# Patient Record
Sex: Female | Born: 1980 | Race: White | Hispanic: No | Marital: Married | Smoking: Never smoker
Health system: Southern US, Community
[De-identification: ages and names within clinical notes are randomized; demographics above are authoritative.]

## PROBLEM LIST (undated history)

## (undated) DIAGNOSIS — R51 Headache: Secondary | ICD-10-CM

## (undated) DIAGNOSIS — K219 Gastro-esophageal reflux disease without esophagitis: Secondary | ICD-10-CM

## (undated) DIAGNOSIS — D229 Melanocytic nevi, unspecified: Secondary | ICD-10-CM

## (undated) DIAGNOSIS — R519 Headache, unspecified: Secondary | ICD-10-CM

## (undated) DIAGNOSIS — K429 Umbilical hernia without obstruction or gangrene: Secondary | ICD-10-CM

## (undated) DIAGNOSIS — IMO0002 Reserved for concepts with insufficient information to code with codable children: Secondary | ICD-10-CM

## (undated) DIAGNOSIS — Z9889 Other specified postprocedural states: Secondary | ICD-10-CM

## (undated) DIAGNOSIS — N803 Endometriosis of pelvic peritoneum: Secondary | ICD-10-CM

## (undated) DIAGNOSIS — G8929 Other chronic pain: Secondary | ICD-10-CM

## (undated) DIAGNOSIS — B029 Zoster without complications: Secondary | ICD-10-CM

## (undated) HISTORY — PX: LAPAROSCOPIC ENDOMETRIOSIS FULGURATION: SUR769

## (undated) HISTORY — DX: Headache, unspecified: R51.9

## (undated) HISTORY — DX: Endometriosis of pelvic peritoneum: N80.3

## (undated) HISTORY — DX: Other chronic pain: G89.29

## (undated) HISTORY — DX: Umbilical hernia without obstruction or gangrene: K42.9

## (undated) HISTORY — DX: Melanocytic nevi, unspecified: D22.9

## (undated) HISTORY — DX: Zoster without complications: B02.9

## (undated) HISTORY — PX: TONSILLECTOMY: SHX5217

## (undated) HISTORY — DX: Headache: R51

## (undated) HISTORY — PX: ABDOMINAL HYSTERECTOMY: SHX81

## (undated) HISTORY — DX: Reserved for concepts with insufficient information to code with codable children: IMO0002

---
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
## 2001-01-17 ENCOUNTER — Encounter (INDEPENDENT_AMBULATORY_CARE_PROVIDER_SITE_OTHER): Payer: Self-pay

## 2001-01-17 ENCOUNTER — Other Ambulatory Visit: Admission: RE | Admit: 2001-01-17 | Discharge: 2001-01-17 | Payer: Self-pay | Admitting: *Deleted

## 2001-02-27 ENCOUNTER — Ambulatory Visit (HOSPITAL_BASED_OUTPATIENT_CLINIC_OR_DEPARTMENT_OTHER): Admission: RE | Admit: 2001-02-27 | Discharge: 2001-02-27 | Payer: Self-pay | Admitting: *Deleted

## 2001-02-27 ENCOUNTER — Encounter (INDEPENDENT_AMBULATORY_CARE_PROVIDER_SITE_OTHER): Payer: Self-pay | Admitting: *Deleted

## 2001-06-03 ENCOUNTER — Other Ambulatory Visit: Admission: RE | Admit: 2001-06-03 | Discharge: 2001-06-03 | Payer: Self-pay | Admitting: *Deleted

## 2002-01-06 ENCOUNTER — Other Ambulatory Visit: Admission: RE | Admit: 2002-01-06 | Discharge: 2002-01-06 | Payer: Self-pay | Admitting: *Deleted

## 2003-01-30 ENCOUNTER — Other Ambulatory Visit: Admission: RE | Admit: 2003-01-30 | Discharge: 2003-01-30 | Payer: Self-pay | Admitting: *Deleted

## 2004-02-23 ENCOUNTER — Other Ambulatory Visit: Admission: RE | Admit: 2004-02-23 | Discharge: 2004-02-23 | Payer: Self-pay | Admitting: Obstetrics and Gynecology

## 2004-07-27 ENCOUNTER — Ambulatory Visit: Payer: Self-pay | Admitting: Family Medicine

## 2004-10-17 ENCOUNTER — Other Ambulatory Visit: Admission: RE | Admit: 2004-10-17 | Discharge: 2004-10-17 | Payer: Self-pay | Admitting: Obstetrics and Gynecology

## 2005-01-10 ENCOUNTER — Other Ambulatory Visit: Admission: RE | Admit: 2005-01-10 | Discharge: 2005-01-10 | Payer: Self-pay | Admitting: Obstetrics and Gynecology

## 2005-04-10 ENCOUNTER — Other Ambulatory Visit: Admission: RE | Admit: 2005-04-10 | Discharge: 2005-04-10 | Payer: Self-pay | Admitting: Obstetrics and Gynecology

## 2005-06-07 ENCOUNTER — Emergency Department (HOSPITAL_COMMUNITY): Admission: EM | Admit: 2005-06-07 | Discharge: 2005-06-07 | Payer: Self-pay | Admitting: Family Medicine

## 2005-12-04 ENCOUNTER — Other Ambulatory Visit: Admission: RE | Admit: 2005-12-04 | Discharge: 2005-12-04 | Payer: Self-pay | Admitting: Obstetrics and Gynecology

## 2006-07-06 ENCOUNTER — Ambulatory Visit: Payer: Self-pay | Admitting: Internal Medicine

## 2006-12-24 ENCOUNTER — Ambulatory Visit: Payer: Self-pay | Admitting: Family Medicine

## 2006-12-25 LAB — CONVERTED CEMR LAB
BUN: 12 mg/dL (ref 6–23)
Basophils Absolute: 0 10*3/uL (ref 0.0–0.1)
Basophils Relative: 0 % (ref 0.0–1.0)
CO2: 28 meq/L (ref 19–32)
Calcium: 9.4 mg/dL (ref 8.4–10.5)
Chloride: 109 meq/L (ref 96–112)
Creatinine, Ser: 0.7 mg/dL (ref 0.4–1.2)
Eosinophils Absolute: 0.8 10*3/uL — ABNORMAL HIGH (ref 0.0–0.6)
Eosinophils Relative: 9.6 % — ABNORMAL HIGH (ref 0.0–5.0)
GFR calc Af Amer: 132 mL/min
GFR calc non Af Amer: 109 mL/min
Glucose, Bld: 84 mg/dL (ref 70–99)
H Pylori IgG: NEGATIVE
HCT: 39 % (ref 36.0–46.0)
Hemoglobin: 13.3 g/dL (ref 12.0–15.0)
Lymphocytes Relative: 38 % (ref 12.0–46.0)
MCHC: 34.2 g/dL (ref 30.0–36.0)
MCV: 90.1 fL (ref 78.0–100.0)
Monocytes Absolute: 0.7 10*3/uL (ref 0.2–0.7)
Monocytes Relative: 8.1 % (ref 3.0–11.0)
Neutro Abs: 3.6 10*3/uL (ref 1.4–7.7)
Neutrophils Relative %: 44.3 % (ref 43.0–77.0)
Platelets: 245 10*3/uL (ref 150–400)
Potassium: 3.9 meq/L (ref 3.5–5.1)
RBC: 4.33 M/uL (ref 3.87–5.11)
RDW: 11.5 % (ref 11.5–14.6)
Sodium: 142 meq/L (ref 135–145)
WBC: 8.3 10*3/uL (ref 4.5–10.5)

## 2006-12-27 ENCOUNTER — Ambulatory Visit: Payer: Self-pay | Admitting: Family Medicine

## 2007-01-01 ENCOUNTER — Ambulatory Visit: Payer: Self-pay | Admitting: Internal Medicine

## 2007-01-02 ENCOUNTER — Ambulatory Visit: Payer: Self-pay | Admitting: Internal Medicine

## 2007-01-10 ENCOUNTER — Ambulatory Visit (HOSPITAL_COMMUNITY): Admission: RE | Admit: 2007-01-10 | Discharge: 2007-01-10 | Payer: Self-pay | Admitting: Internal Medicine

## 2007-02-19 ENCOUNTER — Ambulatory Visit: Payer: Self-pay | Admitting: Internal Medicine

## 2007-12-24 DIAGNOSIS — K589 Irritable bowel syndrome without diarrhea: Secondary | ICD-10-CM

## 2007-12-24 DIAGNOSIS — K219 Gastro-esophageal reflux disease without esophagitis: Secondary | ICD-10-CM

## 2008-06-19 ENCOUNTER — Encounter: Admission: RE | Admit: 2008-06-19 | Discharge: 2008-06-19 | Payer: Self-pay | Admitting: Obstetrics and Gynecology

## 2008-06-19 IMAGING — US UNKNOWN US STUDY
1 series · 1 of 1 positions shown · non-contrast
Comparison: None

CLINICAL DATA: Patient presents for evaluation of palpable left
breast mass.  The patient is 28 weeks pregnant.  She first noticed
mass at the beginning of pregnancy.  She has noted no interval
change but is concerned.

LEFT BREAST ULTRASOUND

[Series 1: unknown us study · 1 of 1 slices shown]
[im 1/1]
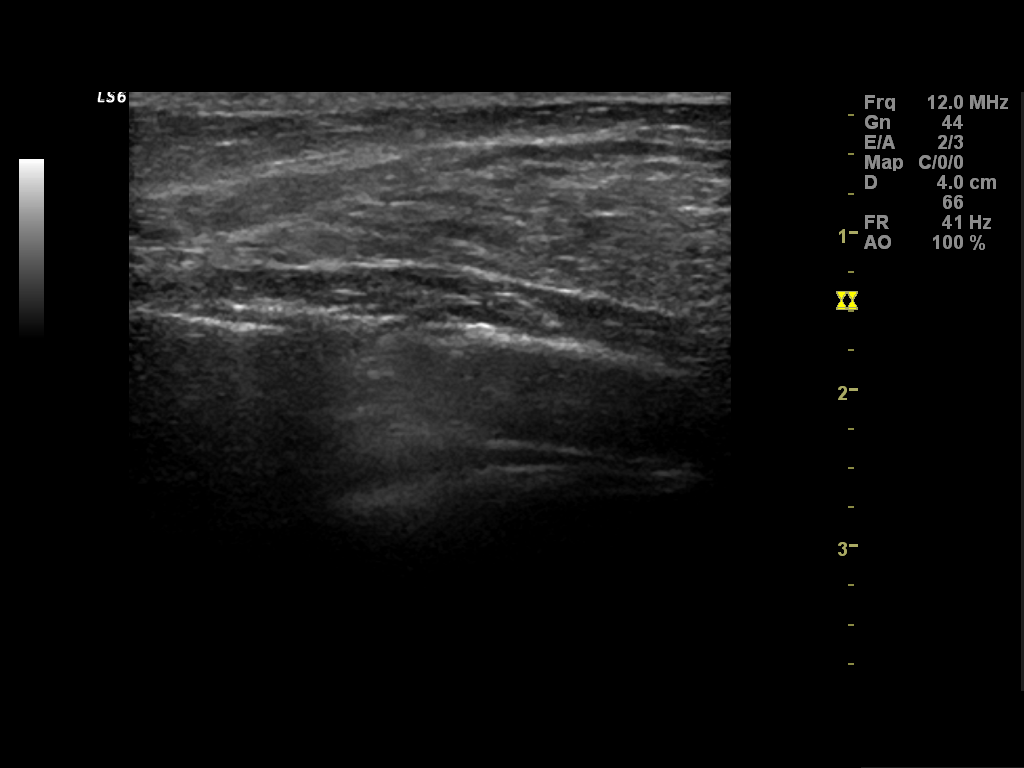

[1 of 1 positions shown; findings below may reference images not displayed]

On physical exam, in the lower inner quadrant of the left breast,
just lateral to the sternum, I palpate a minimal ridge of
thickening, 8 o'clock position.  This feels like an anterior rib
junction.  No discrete mass is palpated.
FINDINGS: Ultrasound is performed, showing normal-appearing
fibroglandular tissue in the lower inner quadrant of the left
breast.  No mass or acoustic shadowing is identified..
IMPRESSION: No mammographic or sonographic evidence for malignancy.  Screening
mammogram is recommended beginning at age 40.

BI-RADS CATEGORY 1:  Negative.

## 2008-07-15 ENCOUNTER — Inpatient Hospital Stay (HOSPITAL_COMMUNITY): Admission: AD | Admit: 2008-07-15 | Discharge: 2008-07-15 | Payer: Self-pay | Admitting: Obstetrics and Gynecology

## 2008-07-17 ENCOUNTER — Inpatient Hospital Stay (HOSPITAL_COMMUNITY): Admission: AD | Admit: 2008-07-17 | Discharge: 2008-07-17 | Payer: Self-pay | Admitting: Obstetrics and Gynecology

## 2008-08-14 ENCOUNTER — Inpatient Hospital Stay (HOSPITAL_COMMUNITY): Admission: AD | Admit: 2008-08-14 | Discharge: 2008-08-14 | Payer: Self-pay | Admitting: Obstetrics and Gynecology

## 2008-08-20 ENCOUNTER — Inpatient Hospital Stay (HOSPITAL_COMMUNITY): Admission: AD | Admit: 2008-08-20 | Discharge: 2008-08-23 | Payer: Self-pay | Admitting: Obstetrics and Gynecology

## 2008-08-20 ENCOUNTER — Encounter (INDEPENDENT_AMBULATORY_CARE_PROVIDER_SITE_OTHER): Payer: Self-pay | Admitting: Obstetrics and Gynecology

## 2009-01-29 ENCOUNTER — Ambulatory Visit: Payer: Self-pay | Admitting: Family Medicine

## 2009-01-29 LAB — CONVERTED CEMR LAB
ALT: 13 units/L (ref 0–35)
AST: 14 units/L (ref 0–37)
Albumin: 4.3 g/dL (ref 3.5–5.2)
Alkaline Phosphatase: 116 units/L (ref 39–117)
BUN: 12 mg/dL (ref 6–23)
Basophils Absolute: 0 10*3/uL (ref 0.0–0.1)
Basophils Relative: 0.4 % (ref 0.0–3.0)
Bilirubin, Direct: 0.1 mg/dL (ref 0.0–0.3)
CO2: 28 meq/L (ref 19–32)
Calcium: 9.9 mg/dL (ref 8.4–10.5)
Chloride: 113 meq/L — ABNORMAL HIGH (ref 96–112)
Creatinine, Ser: 0.6 mg/dL (ref 0.4–1.2)
Eosinophils Absolute: 0.4 10*3/uL (ref 0.0–0.7)
Eosinophils Relative: 6 % — ABNORMAL HIGH (ref 0.0–5.0)
Free T4: 0.8 ng/dL (ref 0.6–1.6)
GFR calc non Af Amer: 127.67 mL/min (ref >60)
Glucose, Bld: 77 mg/dL (ref 70–99)
HCT: 41.9 % (ref 36.0–46.0)
Hemoglobin: 14.4 g/dL (ref 12.0–15.0)
Lymphocytes Relative: 37.5 % (ref 12.0–46.0)
Lymphs Abs: 2.7 10*3/uL (ref 0.7–4.0)
MCHC: 34.2 g/dL (ref 30.0–36.0)
MCV: 90.1 fL (ref 78.0–100.0)
Monocytes Absolute: 0.5 10*3/uL (ref 0.1–1.0)
Monocytes Relative: 7.4 % (ref 3.0–12.0)
Neutro Abs: 3.7 10*3/uL (ref 1.4–7.7)
Neutrophils Relative %: 48.7 % (ref 43.0–77.0)
Platelets: 211 10*3/uL (ref 150.0–400.0)
Potassium: 3.8 meq/L (ref 3.5–5.1)
RBC: 4.66 M/uL (ref 3.87–5.11)
RDW: 12.1 % (ref 11.5–14.6)
Sodium: 143 meq/L (ref 135–145)
T3, Free: 2.9 pg/mL (ref 2.3–4.2)
TSH: 0.7 microintl units/mL (ref 0.35–5.50)
Total Bilirubin: 0.9 mg/dL (ref 0.3–1.2)
Total Protein: 7.5 g/dL (ref 6.0–8.3)
WBC: 7.3 10*3/uL (ref 4.5–10.5)

## 2009-02-15 ENCOUNTER — Telehealth (INDEPENDENT_AMBULATORY_CARE_PROVIDER_SITE_OTHER): Payer: Self-pay | Admitting: *Deleted

## 2009-04-22 ENCOUNTER — Ambulatory Visit: Payer: Self-pay | Admitting: Family Medicine

## 2009-04-22 DIAGNOSIS — M26609 Unspecified temporomandibular joint disorder, unspecified side: Secondary | ICD-10-CM | POA: Insufficient documentation

## 2009-05-21 ENCOUNTER — Ambulatory Visit: Payer: Self-pay | Admitting: Family Medicine

## 2009-05-21 DIAGNOSIS — R030 Elevated blood-pressure reading, without diagnosis of hypertension: Secondary | ICD-10-CM

## 2009-05-24 ENCOUNTER — Encounter: Payer: Self-pay | Admitting: Family Medicine

## 2009-05-24 ENCOUNTER — Telehealth: Payer: Self-pay | Admitting: Family Medicine

## 2009-06-01 ENCOUNTER — Telehealth (INDEPENDENT_AMBULATORY_CARE_PROVIDER_SITE_OTHER): Payer: Self-pay | Admitting: *Deleted

## 2009-06-01 LAB — CONVERTED CEMR LAB
5-HIAA, 24 Hr Urine: 4.6 mg/(24.h) (ref <=6.0)
Catecholamines Tot(E+NE) 24 Hr U: 0.118 mg/24hr — ABNORMAL HIGH
Dopamine 24 Hr Urine: 326 mcg/24hr (ref <500)
Epinephrine 24 Hr Urine: 5 mcg/24hr (ref <20)
Metaneph Total, Ur: 571 ug/24hr (ref 94–604)
Metanephrines, Ur: 113 (ref 25–222)
Norepinephrine 24 Hr Urine: 113 mcg/24hr — ABNORMAL HIGH (ref <80)
Normetanephrine, 24H Ur: 458 — ABNORMAL HIGH (ref 40–412)

## 2009-06-02 ENCOUNTER — Ambulatory Visit: Payer: Self-pay | Admitting: Family Medicine

## 2009-06-02 DIAGNOSIS — F411 Generalized anxiety disorder: Secondary | ICD-10-CM | POA: Insufficient documentation

## 2010-02-14 ENCOUNTER — Inpatient Hospital Stay (HOSPITAL_COMMUNITY): Admission: AD | Admit: 2010-02-14 | Discharge: 2010-02-14 | Payer: Self-pay | Admitting: Obstetrics and Gynecology

## 2010-02-27 ENCOUNTER — Inpatient Hospital Stay (HOSPITAL_COMMUNITY): Admission: AD | Admit: 2010-02-27 | Discharge: 2010-02-28 | Payer: Self-pay | Admitting: Obstetrics and Gynecology

## 2010-02-27 ENCOUNTER — Ambulatory Visit: Payer: Self-pay | Admitting: Obstetrics and Gynecology

## 2010-04-22 ENCOUNTER — Inpatient Hospital Stay (HOSPITAL_COMMUNITY): Admission: RE | Admit: 2010-04-22 | Discharge: 2010-04-25 | Payer: Self-pay | Admitting: Obstetrics and Gynecology

## 2010-05-31 LAB — CONVERTED CEMR LAB: Pap Smear: NORMAL

## 2010-06-14 ENCOUNTER — Ambulatory Visit: Payer: Self-pay | Admitting: Family Medicine

## 2010-06-17 ENCOUNTER — Encounter: Admission: RE | Admit: 2010-06-17 | Discharge: 2010-06-17 | Payer: Self-pay | Admitting: Family Medicine

## 2010-06-17 IMAGING — US US ABDOMEN COMPLETE
1 series · 14 of 25 positions shown · non-contrast
Comparison: None.

CLINICAL DATA: Gastroesophageal reflux disease.  History of
gallstones.  Belching.  Right upper quadrant tenderness.

COMPLETE ABDOMINAL ULTRASOUND

[Series 1: us abdomen complete · 0.21mm/px · 14 of 66 slices shown]
[im 1/66]
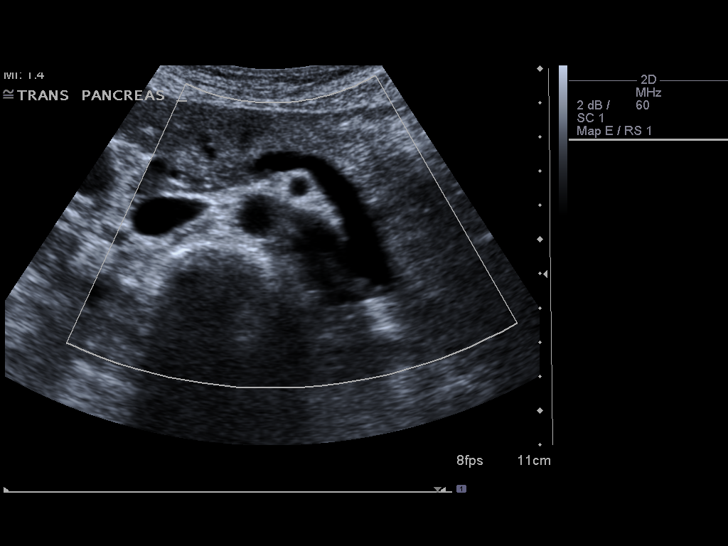
[im 6/66]
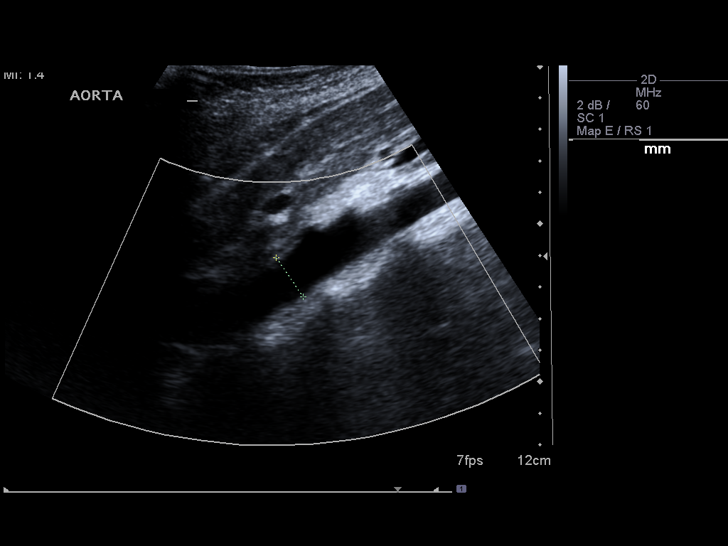
[im 11/66]
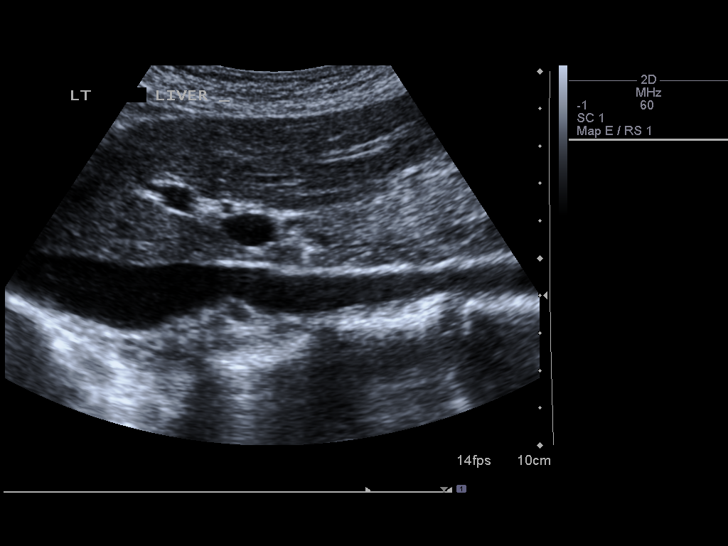
[im 17/66]
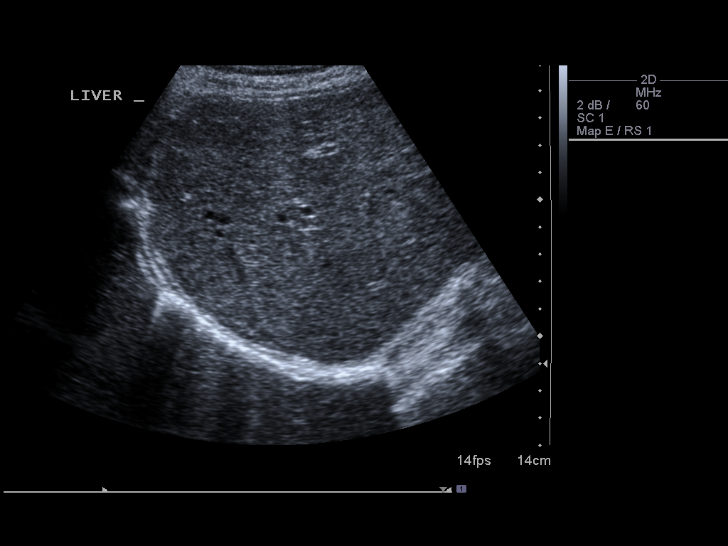
[im 22/66]
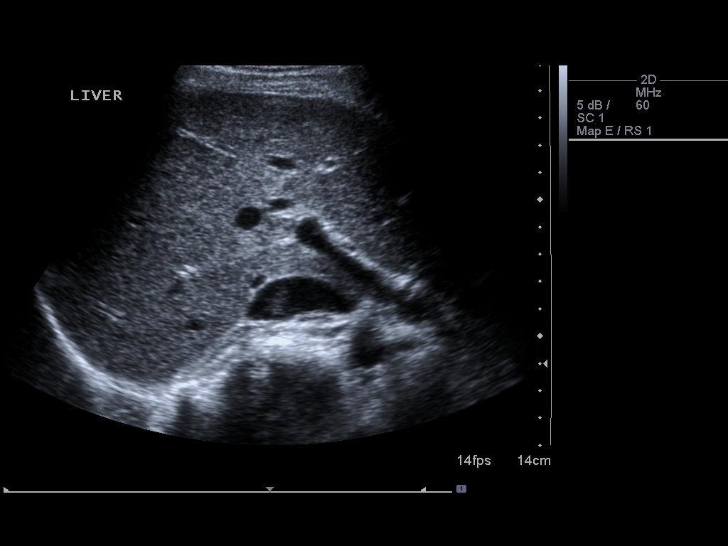
[im 25/66]
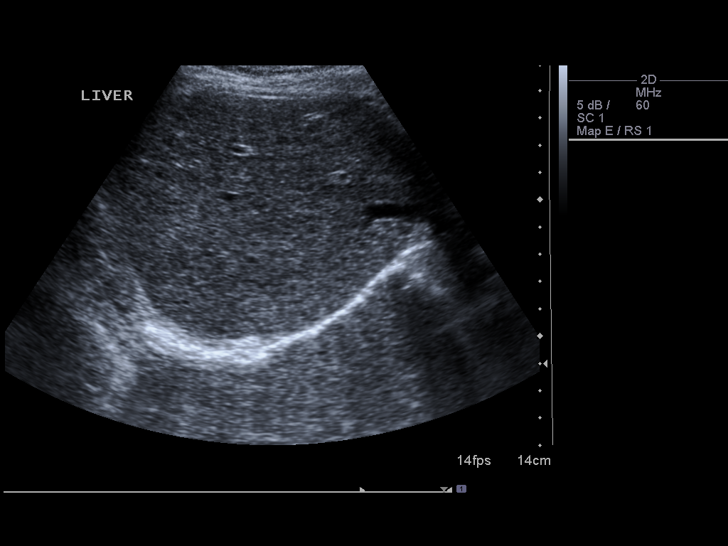
[im 30/66]
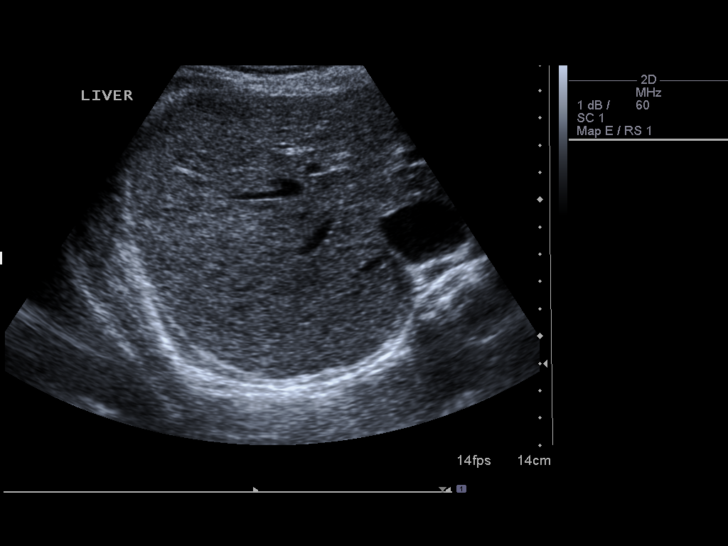
[im 36/66]
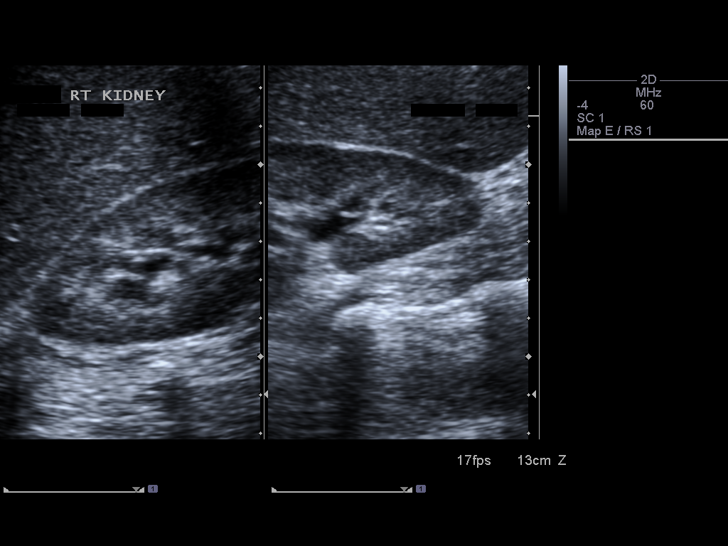
[im 41/66]
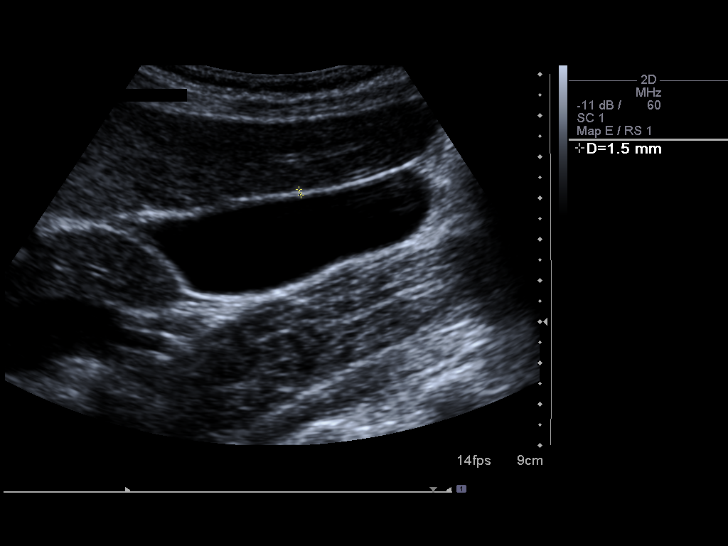
[im 44/66]
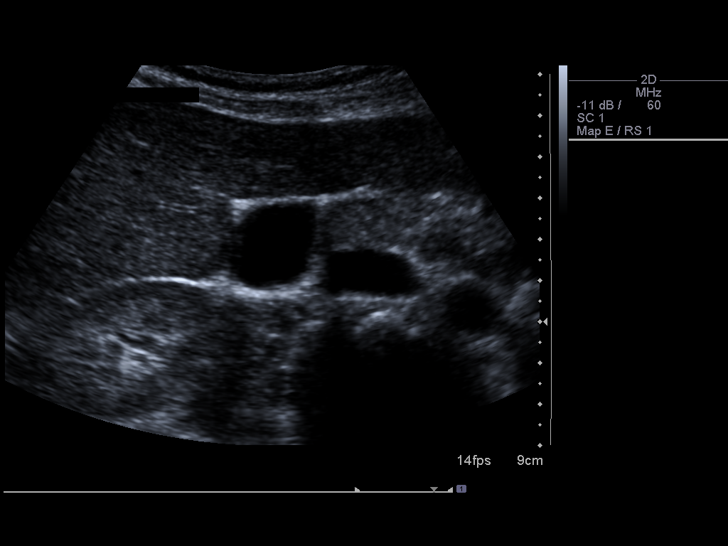
[im 49/66]
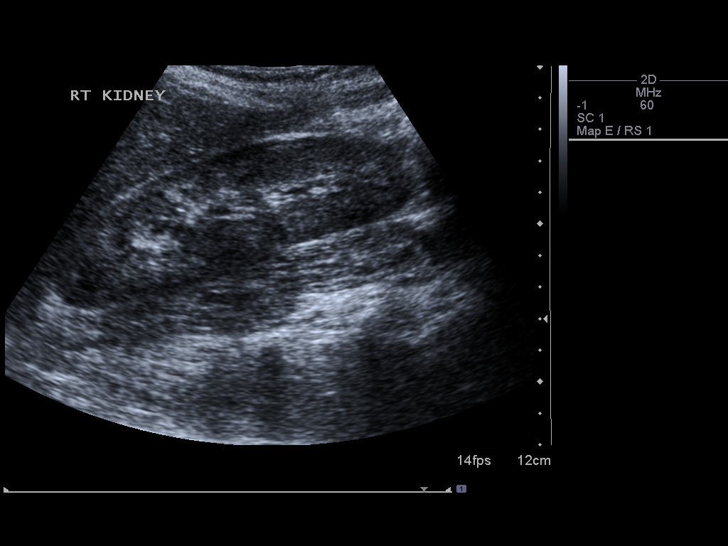
[im 55/66]
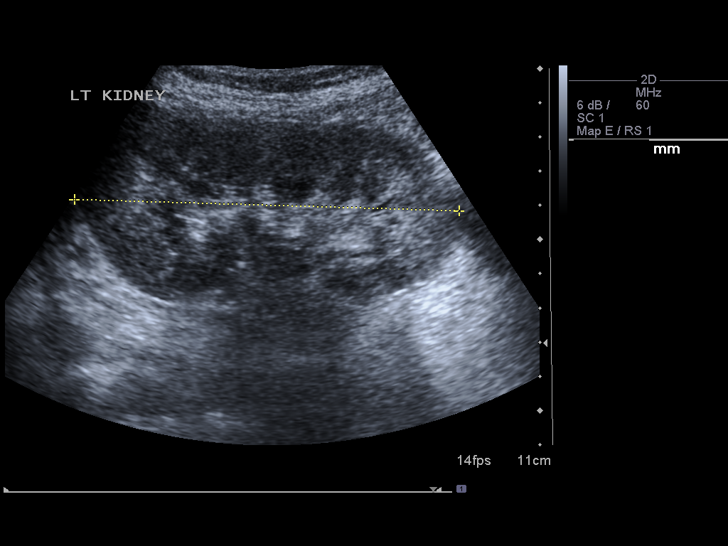
[im 60/66]
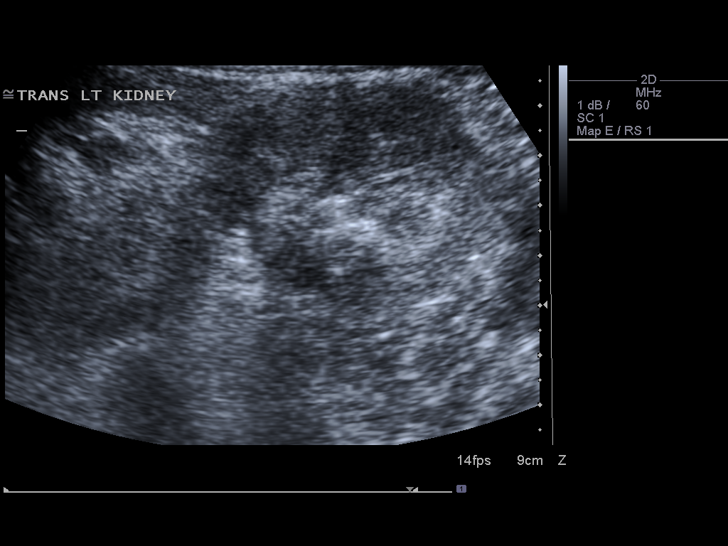
[im 66/66]
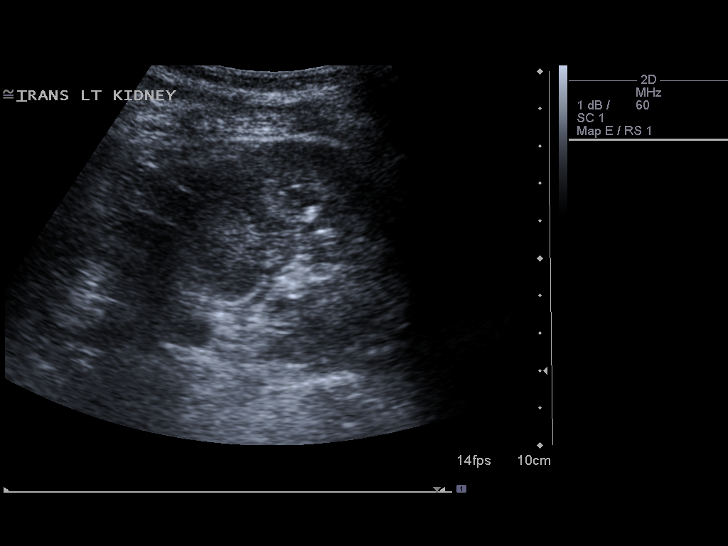

[14 of 25 positions shown; findings below may reference images not displayed]

FINDINGS: Gallbladder:  Negative.

Common bile duct:  Measures 3 mm, within normal limits.

Liver:  Negative.

IVC:  Visualized.

Pancreas:  Negative.

Spleen:  Measures 9.1 cm, negative.

Right Kidney:  Measures 11.9 cm.  Mild pelvocaliectasis.
Parenchymal echo texture is normal.

Left Kidney:  Measures 11.2 cm.  There may be a nonshadowing
echogenic 5 mm stone in the mid pole.  No hydronephrosis.

Abdominal aorta:  No aneurysm identified.
IMPRESSION: 1.  Question small nonobstructing stone in the left kidney.
2.  Mild right pelvocaliectasis.

## 2010-06-20 ENCOUNTER — Telehealth: Payer: Self-pay | Admitting: Family Medicine

## 2010-09-05 ENCOUNTER — Telehealth: Payer: Self-pay | Admitting: Family Medicine

## 2010-09-25 LAB — CONVERTED CEMR LAB
BUN: 10 mg/dL (ref 6–23)
Basophils Absolute: 0 10*3/uL (ref 0.0–0.1)
Basophils Relative: 0.8 % (ref 0.0–3.0)
CO2: 28 meq/L (ref 19–32)
Calcium: 9.9 mg/dL (ref 8.4–10.5)
Chloride: 106 meq/L (ref 96–112)
Creatinine, Ser: 0.5 mg/dL (ref 0.4–1.2)
Eosinophils Absolute: 0.3 10*3/uL (ref 0.0–0.7)
Eosinophils Relative: 4.3 % (ref 0.0–5.0)
Free T4: 0.9 ng/dL (ref 0.6–1.6)
GFR calc non Af Amer: 157.2 mL/min (ref >60)
Glucose, Bld: 92 mg/dL (ref 70–99)
HCT: 42.7 % (ref 36.0–46.0)
Hemoglobin: 14.1 g/dL (ref 12.0–15.0)
Lymphocytes Relative: 40 % (ref 12.0–46.0)
Lymphs Abs: 2.5 10*3/uL (ref 0.7–4.0)
MCHC: 33 g/dL (ref 30.0–36.0)
MCV: 92.3 fL (ref 78.0–100.0)
Monocytes Absolute: 0.5 10*3/uL (ref 0.1–1.0)
Monocytes Relative: 7.8 % (ref 3.0–12.0)
Neutro Abs: 2.9 10*3/uL (ref 1.4–7.7)
Neutrophils Relative %: 47.1 % (ref 43.0–77.0)
Platelets: 238 10*3/uL (ref 150.0–400.0)
Potassium: 4.1 meq/L (ref 3.5–5.1)
RBC: 4.63 M/uL (ref 3.87–5.11)
RDW: 11.3 % — ABNORMAL LOW (ref 11.5–14.6)
Sodium: 139 meq/L (ref 135–145)
T3, Free: 2.3 pg/mL (ref 2.3–4.2)
TSH: 0.61 microintl units/mL (ref 0.35–5.50)
WBC: 6.2 10*3/uL (ref 4.5–10.5)

## 2010-09-27 NOTE — Assessment & Plan Note (Signed)
Summary: TENSION HEADACHES AND HEARTBURN X 20YR//[facility]   Vital Signs:  Patient profile:   30 year old female Height:      64 inches Weight:      123 pounds BMI:     21.19 Temp:     98.8 degrees F oral BP sitting:   110 / 78  (left arm) Cuff size:   regular  Vitals Entered By: Kern Reap CMA Duncan Dull) (June 14, 2010 11:45 AM) CC: heartburn, headaches   CC:  heartburn and headaches.  History of Present Illness: [name] is a 30 year old, married female, who comes in today for evaluation of two problems.  She has a history of TMJ.  She's been through braces x 3 and now they recommending oral surgery, which costs $20,000 she would like to continue to treat it medically.  At one point, we gave her Elavil, but she didn't take it because she was pregnant.  She does have a mouth guard.  We talked about therapy to eliminate all narcotics.  She also has midepigastric burning x 5 years.  She states incontinence, constant it's dull sometimes food makes it worse.  Positive family history of gallbladder disease.  Her father recently had his gallbladder removed.  She's tried a fat free diet and doesn't make much difference in her symptoms.  Allergies: No Known Drug Allergies  Past History:  Past medical, surgical, family and social histories (including risk factors) reviewed, and no changes noted (except as noted below).  Past Medical History: Reviewed history from 12/24/2007 and no changes required. Current Problems:  IBS (ICD-564.1) GERD (ICD-530.81)  Past Surgical History: Reviewed history from 01/29/2009 and no changes required. Caesarean section dysplastic nevi  Family History: Reviewed history from 01/29/2009 and no changes required. Family History Thyroid disease  Social History: Reviewed history from 01/29/2009 and no changes required. 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   Impression & Recommendations:  Problem # 1:  ELEVATED BLOOD PRESSURE WITHOUT DIAGNOSIS OF HYPERTENSION (ICD-796.2) Assessment Unchanged  The following medications were removed from the medication list:    Corgard 20 Mg Tabs (Nadolol) .Marland Kitchen... Take 1 tablet by mouth every morning  Problem # 2:  GERD (ICD-530.81) Assessment: Deteriorated  Orders: Radiology Referral (Radiology)  Complete Medication List: 1)  Pre-natal Tabs (Prenatal multivit-min-fe-fa) .... Once daily 2)  Flexeril 10 Mg Tabs (Cyclobenzaprine hcl) .Marland Kitchen.. 1 tab @ bedtime 3)  Vicodin Es 7.5-750 Mg Tabs (Hydrocodone-acetaminophen) .Marland Kitchen.. 1 tab @ bedtime 4)  Amitriptyline Hcl 25 Mg Tabs (Amitriptyline hcl) .Marland Kitchen.. 1 tab @ bedtime  Patient Instructions: 1)  take 25 mg of Elavil at bedtime, Motrin, 600 mg twice daily with food, where your mouthguard nightly.  We will also give a prescription for Flexeril and Vicodin.  He can take a half of each of bedtime as needed for severe pain. 2)  We will also get set up for an ultrasound of her gallbladder.  In the meantime, stent, a fat-free diet nothing to eat or drink for 3 hours prior to bedtime, two pillows, stop the Zantac, Prilosec, 20 mg b.i.d.  Follow-up with me about 3 to 4 days after your ultrasound Prescriptions: AMITRIPTYLINE HCL 25 MG TABS (AMITRIPTYLINE HCL) 1 tab @ bedtime  #100 x 3  Entered and Authorized by:   Roderick Pee MD   Signed by:   Roderick Pee MD on 06/14/2010   Method used:   Print then Give to Patient   RxID:   [id number] VICODIN ES 7.5-750 MG TABS (HYDROCODONE-ACETAMINOPHEN) 1 tab @ bedtime  #30 x 2    Entered and Authorized by:   Roderick Pee MD   Signed by:   Roderick Pee MD on 06/14/2010   Method used:   Print then  Give to Patient   RxID:   [id number] FLEXERIL 10 MG TABS (CYCLOBENZAPRINE HCL) 1 tab @ bedtime  #30 x 2   Entered and Authorized by:   Roderick Pee MD   Signed by:   Roderick Pee MD on 06/14/2010   Method used:   Print then Give to Patient   RxID:   [id number]    Orders Added: 1)  Est. Patient Level IV [47425] 2)  Radiology Referral [Radiology]   Immunization History:  Influenza Immunization History:    Influenza:  historical (05/28/2010)  Tetanus/Td Immunization History:    Tetanus/Td:  historical (08/29/2007)   Immunization History:  Influenza Immunization History:    Influenza:  Historical (05/28/2010)  Tetanus/Td Immunization History:    Tetanus/Td:  Historical (08/29/2007)    Preventive Care Screening  Pap Smear:    Date:  05/31/2010    Next Due:  05/2011    Results:  normal

## 2010-09-27 NOTE — Progress Notes (Signed)
Summary: FYI  Phone Note Call from Patient Call back at Home Phone [phone]   Caller: Patient---live call Reason for Call: Lab or Test Results Summary of Call: would like ultrasound results from last Friday. Initial call taken by: Warnell Forester,  June 20, 2010 10:00 AM  Follow-up for Phone Call        Fleet Contras please call ultrasound was normal, but if  still symptomatic would recommend GI consult Follow-up by: Roderick Pee MD,  June 20, 2010 12:15 PM  Additional Follow-up for Phone Call Additional follow up Details #1::        patient is taking prilosec and low fat diet which is helping she will call back if she wants to go to GI. Additional Follow-up by: Kern Reap CMA Duncan Dull),  June 20, 2010 12:17 PM

## 2010-09-29 NOTE — Progress Notes (Signed)
Summary: Pt req refill of Vicodin for back pain  Phone Note Refill Request Call back at Home Phone [phone] Message from:  Patient on September 05, 2010 8:59 AM  Refills Requested: Medication #1:  VICODIN ES 7.5-750 MG TABS 1 tab @ bedtime   Dosage confirmed as above?Dosage Confirmed Pt req refill of pain med for back pain. Pts orthopedic doctor told pt, that since Dr Tawanna Cooler prescribed pt pain med, then pt should get refills from Dr. Tawanna Cooler. Pls call in to [facility] [address] in [place]  or pt will pick up script.        Method Requested: Telephone to Pharmacy Initial call taken by: Lucy Antigua,  September 05, 2010 9:01 AM Caller: Patient  Follow-up for Phone Call        Fleet Contras please call her pharmacy..........[facility] prescription for Vicodin ES, number 30 directions one half to one tab nightly as needed. for her jaw pain, refills x 1 Follow-up by: Roderick Pee MD,  September 05, 2010 6:20 PM    Prescriptions: VICODIN ES 7.5-750 MG TABS (HYDROCODONE-ACETAMINOPHEN) 1 tab @ bedtime  #30 x 1   Entered by:   Kern Reap CMA (AAMA)   Authorized by:   Roderick Pee MD   Signed by:   Kern Reap CMA (AAMA) on 09/06/2010   Method used:   Telephoned to ...       [facility]  [address] [phone]* (retail)       [address]       [place], [place]  [address]       Ph: [phone] or [phone]       Fax: [phone]   RxID:   [id number]

## 2010-11-11 LAB — SURGICAL PCR SCREEN
MRSA, PCR: NEGATIVE
Staphylococcus aureus: NEGATIVE

## 2010-11-11 LAB — CBC
Hemoglobin: 10.1 g/dL — ABNORMAL LOW (ref 12.0–15.0)
MCH: 32.8 pg (ref 26.0–34.0)
MCV: 95.5 fL (ref 78.0–100.0)
Platelets: 167 10*3/uL (ref 150–400)
Platelets: 205 10*3/uL (ref 150–400)
RBC: 3.09 MIL/uL — ABNORMAL LOW (ref 3.87–5.11)
RDW: 12.6 % (ref 11.5–15.5)
WBC: 13.4 10*3/uL — ABNORMAL HIGH (ref 4.0–10.5)
WBC: 13.8 10*3/uL — ABNORMAL HIGH (ref 4.0–10.5)

## 2010-11-13 LAB — URINALYSIS, ROUTINE W REFLEX MICROSCOPIC
Bilirubin Urine: NEGATIVE
Glucose, UA: NEGATIVE mg/dL
Ketones, ur: 15 mg/dL — AB
Leukocytes, UA: NEGATIVE
Nitrite: NEGATIVE
Specific Gravity, Urine: >1.03 — ABNORMAL HIGH (ref 1.005–1.030)
pH: 6.5 (ref 5.0–8.0)

## 2010-11-13 LAB — URINE MICROSCOPIC-ADD ON

## 2010-11-21 ENCOUNTER — Encounter: Payer: Self-pay | Admitting: Family Medicine

## 2010-11-21 ENCOUNTER — Ambulatory Visit (INDEPENDENT_AMBULATORY_CARE_PROVIDER_SITE_OTHER): Payer: Commercial Managed Care - PPO | Admitting: Family Medicine

## 2010-11-21 VITALS — BP 118/80 | Temp 99.0°F | Ht 66.0 in | Wt 117.0 lb

## 2010-11-21 DIAGNOSIS — G43819 Other migraine, intractable, without status migrainosus: Secondary | ICD-10-CM

## 2010-11-21 MED ORDER — HYDROCODONE-ACETAMINOPHEN 7.5-750 MG PO TABS: 1.0000 | ORAL_TABLET | Freq: Every day | ORAL | Status: DC

## 2010-11-21 MED ORDER — PREDNISONE 20 MG PO TABS: ORAL_TABLET | ORAL | Status: DC

## 2010-11-21 NOTE — Progress Notes (Signed)
  Subjective:    Patient ID: [name], female    DOB: [birth year], 30 y.o.   MRN: [id number]  HPICrystal is a 30 year old, married female, nonsmoker comes in today for evaluation of a headache for 3 weeks.  She states that about 3 weeks ago she developed a severe bitemporal headache.  It eased off.  She that day.  She had no other symptoms went to sleep and the next day about 11 a.m. And start all over again.  This is been the pattern since that time.  The headache is worse as an 8 on a scale of one to 10.  It's a two or 3 constantly.  However, it does not interfere with her sleep.  She has no photophobia, phonophobia, nausea, vomiting, et Karie Soda.  Neurologic review of systems otherwise negative.  She's had headaches in the past, but does not think they were migraines.  LMP 3 weeks ago, normal for birth control use condoms.    Review of Systems General and neurologic review of systems negative    Objective:   Physical Exam     Well-developed well-nourished, female, in no acute distress.  HEENT negative.  Neck was supple.  No adenopathy.  Lungs clear.  Neurologic exam normal   Assessment & Plan:  Cluster migraines............ Prednisone burst and taper return in one week for follow-up

## 2010-11-21 NOTE — Patient Instructions (Signed)
Prednisone take 3 tablets now then starting tomorrow morning two tabs x 3 days, one x 3 days, a half x 3 days, then half a tablet Monday, Wednesday, Friday, for a two week taper.  Return in one week for follow-up, sooner if any problems

## 2011-01-10 NOTE — Op Note (Signed)
NAMEAMMY, LIENHARD               ACCOUNT NO.:  [id number]   MEDICAL RECORD NO.:  [id number]          PATIENT TYPE:  INP   LOCATION:  9124                          FACILITY:  [facility]   PHYSICIAN:  Dineen Kid. Rana Snare, M.D.    DATE OF BIRTH:  1981/07/25   DATE OF PROCEDURE:  08/20/2008  DATE OF DISCHARGE:                               OPERATIVE REPORT   PREOPERATIVE DIAGNOSES:  1. Intrauterine pregnancy at 90 and 6/7 weeks.  2. Breech labor.  3. Oligohydramnios.  4. Pregnancy-induced hypertension.   POSTOPERATIVE DIAGNOSES:  1. Intrauterine pregnancy at 63 and 6/7 weeks.  2. Breech labor.  3. Oligohydramnios.  4. Pregnancy-induced hypertension.   PROCEDURE:  Primary low-segment transverse cesarean section.   SURGEON:  Dineen Kid. Rana Snare, MD   ANESTHESIA:  Spinal.   INDICATIONS:  Ms. Marmo is a 30 year old G1 at 37 and 6/7 weeks.  She  has been on bedrest for last 2 weeks due to elevated blood pressures.  Her normal blood pressures are in 100/50 range, in the last several  weeks they have been in the 140s/90s.  She presents today to the office  for routine obstetrical care, complained of headaches for the last 2  days, her blood pressure was 140/90, negative protein.  She was  contracting for 2-3 minutes, which were moderate.  She is a breech  presentation.  Ultrasound also showed oligohydramnios with an AFI of 8.  Her cervix is 2, 50%.  We planned to proceed with primary cesarean  section due to breech labor, oligohydramnios, and pregnancy-induced  hypertension.  The risks and benefits were discussed and informed  consent was obtained.   FINDINGS AT THE TIME OF SURGERY:  A viable female infant, Apgars were 8  and 9, pH arterial 7.41 with the weight of 5 pounds 15 ounces.   DESCRIPTION OF PROCEDURE:  After adequate analgesia, the patient was  placed in the supine position, left lateral tilt.  She was sterilely  prepped and draped.  Bladder sterilely drained with a Foley catheter.  A  Pfannenstiel skin incision was made two fingerbreadths above pubic  symphysis, taken down sharply to the fascia, which was incised  transversely, extended superiorly, inferiorly off the bellies of the  rectus muscle, which separated sharply in midline.  Peritoneum was  entered sharply.  Bladder flap created and placed behind the bladder  blade.  Low segment myotomy incision was made down to the amniotic sac,  extended laterally with the operator's fingertips.  The infant's  buttocks was delivered atraumatically, her legs easily reduced, arms  reduced, and head delivered atraumatically.  The nares and pharynx were  then suctioned.  Cord clamped and cut.  The baby was handed to the  pediatricians for resuscitation.  Cord blood was obtained.  Placenta  extracted manually.  Uterus was exteriorized, wiped clean with a dry  lap.  The myotomy incision was closed in two layers, first being a  running locking layer, the second being imbricating layer of 0 Monocryl  suture.  The uterus was placed back into peritoneal cavity, and after  copious amount of  irrigation, adequate hemostasis was assured.  The  peritoneum was closed with 0 Monocryl.  Rectus muscle plicated in the  midline.  Irrigation was applied after adequate hemostasis, the fascia  was closed with #1 Vicryl in running fashion.  Irrigation was applied,  after adequate hemostasis.  The Scarpa fascia was reapproximated with 2-  0 plain suture.  The skin was then stapled, Steri-Strips applied.  The  patient tolerated the procedure well, was stable, and transferred to  recovery room.  Sponge count was normal x3.  Estimated blood loss was  500 mL.  The patient received 1 g of cefotetan preoperatively.      Dineen Kid Rana Snare, M.D.  Electronically Signed     DCL/MEDQ  D:  08/20/2008  T:  08/21/2008  Job:  [id number]

## 2011-01-10 NOTE — H&P (Signed)
[name], [name]               ACCOUNT NO.:  [id number]   MEDICAL RECORD NO.:  [id number]          PATIENT TYPE:  INP   LOCATION:  9372                          FACILITY:  [facility]   PHYSICIAN:  Dineen Kid. Rana Snare, M.D.    DATE OF BIRTH:  1980/09/18   DATE OF ADMISSION:  08/20/2008  DATE OF DISCHARGE:                              HISTORY & PHYSICAL   She is being admitted to [facility] for surgery.   HISTORY OF PRESENT ILLNESS:  [name] is a twin.  She is a 30 year old  G1 at 36-6/7 weeks who has been on bedrest for preterm labor and also  for preeclampsia.  Today she presents to the office with worsening  headache over the last day or 2, elevated blood pressure of 140/90.  She  is contracting every 2 to 3 minutes.  The cervix has changed to 2 cm,  60%.  She has a breech presentation fetus and also low fluid with AFI of  8.4 cm.  Because of the above constellation of symptoms and plan to  deliver her, and she presents for primary cesarean section.  Her  estimated date by was September 11, 2008.   PAST MEDICAL HISTORY:  Significant only for infertility and  endometriosis.   PAST SURGICAL HISTORY:  She had a laparoscopy with removal of  endometriosis and a tonsillectomy, and bilateral hernia repair.   MEDICATIONS:  Include prenatal vitamins, terbutaline as needed.   ALLERGIES:  She has no known drug allergies.   PHYSICAL EXAM:  VITAL SIGNS:  Her blood pressure 140/90.  HEART:  Regular rate and rhythm.  LUNGS:  Clear to auscultation bilaterally.  ABDOMEN:  Gravid, nontender.  GENITOURINARY:  Cervix is 260 minus 2, fetal heart rates in the 150s.  NEUROLOGIC:  Deep tendon reflexes are 3/4 without clonus.   IMPRESSION AND PLAN:  1. Intrauterine pregnancy at 36-6/7 weeks, breech presentation, and      labor.  Plan is delivery by cesarean section.  2. Pregnancy-induced hypertension, previously controlled on bed rest.      She has no signs of CNS symptoms.  We will check preeclampsia  labs      and plan to be towards delivery.  3. Oligohydramnios.   PLAN:  Delivery.  Primary low segment transverse cesarean section.  The  risks, benefits of procedure discussed at length which include but not  limited to risk of infection, bleeding, damage to the uterus, tubes,  ovaries, bowel, bladder and fetus, risks associated with anesthesia, and  blood transfusion.  She gives informed consent and wished to proceed.      Dineen Kid Rana Snare, M.D.  Electronically Signed     DCL/MEDQ  D:  08/20/2008  T:  08/20/2008  Job:  [id number]

## 2011-01-10 NOTE — Assessment & Plan Note (Signed)
[facility]                         GASTROENTEROLOGY OFFICE NOTE   JAMYAH, FOLK                      MRN:          [id number]  DATE:02/19/2007                            DOB:          12/23/1980    HISTORY:  [name] presents today for followup.  She is a 30 year old,  who was evaluated Jan 01, 2007, for nausea and vomiting.  See that  dictation for details.  She was felt to have reflux disease and  irritable bowel syndrome.  She was continued on Prilosec 20 mg b.i.d.  and scheduled for upper endoscopy.  Upper endoscopy was entirely normal.  She subsequently underwent a solid phase gastric emptying scan.  This  was also normal.  She presents now for followup.  Since that time, she  is feeling much better.  No further problems with nausea or vomiting.  No indigestion or heartburn.  Her only complaint is that of burping.   CURRENT MEDICATIONS INCLUDE:  1. Birth control pill.  2. Prilosec.  3. Multivitamin.  4. She also uses Bentyl p.r.n.   PHYSICAL EXAM:  Finds a well-appearing female in no acute distress.  Blood pressure 108/68, heart rate is 72, weight is 120 pounds.  The  abdomen was not re-examined.   IMPRESSION:  1. Gastroesophageal reflux disease.  Symptoms controlled with b.i.d.      Prilosec.  2. Tendency toward irritable bowel.   RECOMMENDATIONS:  1. Continue proton pump inhibitor therapy.  2. Return to the general care of Dr. Tawanna Cooler.  GI followup p.r.n.     Wilhemina Bonito. Marina Goodell, MD  Electronically Signed    JNP/MedQ  DD: 02/19/2007  DT: 02/20/2007  Job #: [id number]   cc:   Tinnie Gens A. Tawanna Cooler, MD

## 2011-01-10 NOTE — Assessment & Plan Note (Signed)
[facility]                         GASTROENTEROLOGY OFFICE NOTE   [name], [name]                      MRN:          [id number]  DATE:01/01/2007                            DOB:          20-Jan-1981    REASON FOR CONSULTATION:  Nausea and vomiting.   HISTORY:  This is a pleasant 30 year old white female with no  significant past medical history who is referred through the courtesy of  Dr. Tawanna Cooler regarding recent problems with nausea and vomiting.  Patient  reports occasional problems with epigastric gnawing discomfort, which  she describes as a hunger pain, which might be associated nausea.  For  this, she might eat or take an antacid, and has relief.  However, over  the past 12 days, she has had an exacerbation of her symptoms.  She  describes hyperactive bowel sounds.  She has had about 3 episodes of  vomiting.  She will notice food that she had eaten 6 hours earlier.  As  well, she has been having some regurgitation.  She was placed on  Prilosec 20 mg b.i.d., which has not helped symptoms.  She is not having  true pyrosis.  The nausea seems almost constant.  She did undergo some  laboratories on December 25, 2006.  CBC was normal.  Her eosinophils were  9.6%.  Basic metabolic panel was normal.  Helicobacter pylori antibody  was negative.  The patient has been on birth control pills daily for  many years.  She did do a home pregnancy test recently, which was  negative.  Her last menstrual period was last week.  Her birth control  pill has been stable over the past 2 to 3 years.  She has had slight  decrease in her appetite, and about a 3-pound weight loss.  She reports  more chronic problems consistent with irritable bowel.  She has  noticed  this since she was a child.  Occasional postprandial problems associated  with some urgency and loose stools.  Often prominent when out in public.  She feels that more chronic problem is different than this  more acute  issue.   She does take nonsteroidal antiinflammatory drugs (generally ibuprofen  800 mg) several times per week for chronic headaches.  She thinks she  saw a gastroenterologist as a child, but is not certain.   PAST MEDICAL HISTORY:  None.   PAST SURGICAL HISTORY:  None.   ALLERGIES:  NO KNOWN DRUG ALLERGIES.   CURRENT MEDICATIONS:  1. YAZ birth control pill.  2. Prilosec 20 mg b.i.d.  3. Multivitamin.  4. Bentyl p.r.n.   FAMILY HISTORY:  No family history of gastrointestinal malignancy,  inflammatory bowel disease, or other relevant disorders.   SOCIAL HISTORY:  The patient is married without children.  She lives  with her husband.  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.  She does not smoke or use alcohol.   REVIEW OF SYSTEMS:  Per diagnostic evaluation form.   PHYSICAL EXAMINATION:  Well-appearing female, in no acute distress.  Blood pressure is 112/62.  Heart rate is 86  and regular.  Weight is  121.4 pounds.  She is 5 feet 6-1/2 inches in height.  HEENT:  Sclerae anicteric.  Conjunctivae are pink.  Oral mucosa is  intact.  No adenopathy.  LUNGS:  Are clear.  HEART:  Is regular.  ABDOMEN:  Soft without tenderness, mass, or hernia.  No succussion  splash.  Good bowel sounds heard.  EXTREMITIES:  Without edema.   IMPRESSION:  1. This is a 30 year old female who presents with chief complaint of      persistent nausea with intermittent vomiting.  This, on the      background of what sounds like mild reflux disease.  No response to      proton pump inhibitor therapy.  I am wondering whether she may have      partial gastric outlet obstruction due to ulcer, or possibly      gastroparesis.  This is of particular concern, given the contents      of vomitus on several occasions.  2. Probable irritable bowel, which is unrelated.   RECOMMENDATIONS:  1. Continue proton pump inhibitor therapy.  2. Schedule diagnostic upper endoscopy.  The  nature of the procedure,      as well as the risks, benefits, and alternatives have been      reviewed.  She understood, and agreed to proceed.  3. If upper endoscopy negative, then schedule solid phase gastric      emptying scan.     Wilhemina Bonito. Marina Goodell, MD  Electronically Signed    JNP/MedQ  DD: 01/01/2007  DT: 01/01/2007  Job #: [id number]   cc:   Tinnie Gens A. Tawanna Cooler, MD

## 2011-01-13 NOTE — Discharge Summary (Signed)
[name], [name]               ACCOUNT NO.:  [id number]   MEDICAL RECORD NO.:  [id number]          PATIENT TYPE:  INP   LOCATION:  9124                          FACILITY:  [facility]   PHYSICIAN:  Zelphia Cairo, MD    DATE OF BIRTH:  [birth year]   DATE OF ADMISSION:  08/20/2008  DATE OF DISCHARGE:  08/23/2008                               DISCHARGE SUMMARY   ADMITTING DIAGNOSES:  1. Intrauterine pregnancy at 36-6/7 weeks estimated gestational age.  2. Preeclampsia.  3. Breech presentation.  4. Oligohydramnios.   DISCHARGE DIAGNOSES:  1. Status post low transverse cesarean section.  2. Viable female infant.   PROCEDURE:  Primary low transverse cesarean section.   REASON FOR ADMISSION:  Please see dictated H&P.   [facility] COURSE:  The patient is 30 year old primigravida who was  admitted to [name] at 36-6/7 weeks estimated  gestational age for cesarean delivery.  The patient's pregnancy had been  complicated by preterm labor and also preeclampsia.  On the day of  admission, the patient had presented to the office earlier with  worsening headache over the last day or two with elevated blood pressure  of 140/90.  The patient was also noted to be contracting approximately  every 2-3 minutes.  Cervix was dilated 2 cm, 60% effaced.  The baby was  in a breech presentation.  Ultrasound had revealed a low AFI of 8.4.  Due to the constellation of symptoms and plan to delivery, decision was  made to proceed with a primary low transverse cesarean section.  The  patient was now admitted to the [facility] where she was taken to the  operating room where spinal anesthesia was administered without  difficulty.  A low transverse incision was made with delivery of a  viable female infant weighing 5 pounds 15 ounces, Apgars of 8 at 1  minute and 9 at 5 minutes.  Arterial cord pH was 7.41.  The patient  tolerated the procedure well and was taken to the recovery room in  stable  condition.  On postoperative day #1, the patient was without  complaint.  Vital signs were stable.  She is afebrile.  Abdomen soft  with good return of bowel function.  Fundus was firm and nontender.  Laboratory findings revealed hemoglobin of 10.1.  [facility] labs were  within normal limits.  On postoperative day #2, the patient did have  some discomfort with ambulation.  Vital signs were otherwise stable.  She was afebrile.  Fundus firm and nontender.  Abdominal dressing had  been removed revealing an incision that was clean, dry, and intact.  Postoperative day #3, the patient was without complaint.  Vital signs  were stable.  She was afebrile.  Fundus firm and nontender.  Incision  was clean, dry, and intact.  Staples removed and the patient was later  discharged to home.   CONDITION ON DISCHARGE:  Stable.   DIET:  Regular as tolerated.   ACTIVITY:  No heavy lifting, no driving x2 weeks, no vaginal entry.   FOLLOWUP:  The patient is to follow up in the office  in 1-2 weeks for an  incision check.  She is to call for temperature greater than 100  degrees, persistent nausea, vomiting, heavy vaginal bleeding, and/or  redness or drainage from the incisional site.   DISCHARGE MEDICATIONS:  1. Tylox #30 one p.o. 4-6 hours p.r.n.  2. Motrin 600 mg every 6 hours.  3. Prenatal vitamins 1 p.o. daily.  4. Colace 1 p.o. daily p.r.n.      Julio Sicks, N.P.      Zelphia Cairo, MD  Electronically Signed    CC/MEDQ  D:  09/16/2008  T:  09/16/2008  Job:  [id number]

## 2011-01-13 NOTE — Op Note (Signed)
[place]  Patient:    [name], [name]                      MRN: [id number] Proc. Date: 02/27/01 Adm. Date:  [id number] Attending:  Aundria Mems                           Operative Report  PREOPERATIVE DIAGNOSIS: Chronic adenotonsillitis.  POSTOPERATIVE DIAGNOSIS: Chronic adenotonsillitis.  OPERATION/PROCEDURE: Adenotonsillectomy.  SURGEON: Kathy Breach, M.D.  DESCRIPTION OF PROCEDURE: With the patient under general orotracheal anesthesia the Crowe-Davis mouth gag was inserted and the patient put in the [facility] position.  Inspection of the oral cavity revealed normal appearing soft palate.  The hard palate was intact to palpation.  The tonsils were deeply cryptic, invasive within the tonsillar fossa, and 1+ enlarged.  A red rubber catheter was passed through the left nasal chamber in order to elevate the soft palate.  Mirror visualization of the nasopharynx revealed moderate sized adenoid tissue present in the nasopharynx.  It was elected to ablate this with electrocautery rather than remove by curettage.  Using suction cautery under mirror visualization I took some time to gradually completely ablate by coagulation the significant adenoid tissue.  The left tonsil was then grasped by the superior pole and the tonsil removed by electrical dissection, maintaining complete hemostasis with electrocauterization.  The right tonsil was removed in similar fashion.  Blood loss estimated during the procedure was less than 10 cc.  The patient tolerated the procedure well and was taken to the recovery room in stable general condition. DD:  02/27/01 TD:  02/27/01 Job: [id number]

## 2011-01-26 ENCOUNTER — Other Ambulatory Visit: Payer: Self-pay | Admitting: Family Medicine

## 2011-01-26 DIAGNOSIS — G43819 Other migraine, intractable, without status migrainosus: Secondary | ICD-10-CM

## 2011-01-26 MED ORDER — HYDROCODONE-ACETAMINOPHEN 7.5-750 MG PO TABS: 1.0000 | ORAL_TABLET | Freq: Every day | ORAL | Status: DC

## 2011-01-26 NOTE — Telephone Encounter (Signed)
Pt called and said that [facility] [place], [place] sent a req to get refill for pts Hydrocodone 7.5 -750 mg on Tues. Pt has checked with pharmacy and nothing has been called in. Pt leaving in a.m. To go out town. Pt is needing this med to be called in today. Pt having migraines.

## 2011-03-30 ENCOUNTER — Ambulatory Visit (INDEPENDENT_AMBULATORY_CARE_PROVIDER_SITE_OTHER): Payer: Commercial Managed Care - PPO | Admitting: General Surgery

## 2011-03-30 ENCOUNTER — Encounter (INDEPENDENT_AMBULATORY_CARE_PROVIDER_SITE_OTHER): Payer: Self-pay | Admitting: General Surgery

## 2011-03-30 DIAGNOSIS — K429 Umbilical hernia without obstruction or gangrene: Secondary | ICD-10-CM

## 2011-03-30 NOTE — Patient Instructions (Signed)
If the hernia gets larger or starts causing pain I, would advise you have it fixed.

## 2011-03-30 NOTE — Progress Notes (Signed)
She is is an established patient with a known umbilical hernia. The last time I saw her she is in the third trimester of pregnancy. The hernia was quite pronounced at that time. She still has the umbilical hernia but she is is not hurting her.  PE: Gen.-thin female, no acute distress.  Abdomen-small easily reducible umbilical hernia. The defect is 8-10 mm maximum. It is nontender.  Assessment: Small asymptomatic umbilical hernia. Options including expectant management versus repair.  She favors not having it repaired at this time.  Plan: If the hernia becomes symptomatic or larger, I recommend repair. Would have her return at that time.

## 2011-05-23 ENCOUNTER — Other Ambulatory Visit: Payer: Self-pay | Admitting: *Deleted

## 2011-05-23 DIAGNOSIS — G43819 Other migraine, intractable, without status migrainosus: Secondary | ICD-10-CM

## 2011-05-23 MED ORDER — HYDROCODONE-ACETAMINOPHEN 7.5-750 MG PO TABS: 1.0000 | ORAL_TABLET | Freq: Every day | ORAL | Status: DC

## 2011-05-30 LAB — FETAL FIBRONECTIN: Fetal Fibronectin: NEGATIVE

## 2011-05-30 LAB — STREP B DNA PROBE

## 2011-06-02 LAB — CBC
HCT: 37 % (ref 36.0–46.0)
Hemoglobin: 12.6 g/dL (ref 12.0–15.0)
MCHC: 34 g/dL (ref 30.0–36.0)
MCV: 94.2 fL (ref 78.0–100.0)
RBC: 3.1 MIL/uL — ABNORMAL LOW (ref 3.87–5.11)
RBC: 3.94 MIL/uL (ref 3.87–5.11)
RBC: 4.14 MIL/uL (ref 3.87–5.11)
RDW: 12.6 % (ref 11.5–15.5)
WBC: 14 10*3/uL — ABNORMAL HIGH (ref 4.0–10.5)
WBC: 14.4 10*3/uL — ABNORMAL HIGH (ref 4.0–10.5)

## 2011-06-02 LAB — COMPREHENSIVE METABOLIC PANEL
ALT: 12 U/L (ref 0–35)
ALT: 13 U/L (ref 0–35)
AST: 20 U/L (ref 0–37)
Alkaline Phosphatase: 107 U/L (ref 39–117)
Alkaline Phosphatase: 111 U/L (ref 39–117)
BUN: 11 mg/dL (ref 6–23)
BUN: 9 mg/dL (ref 6–23)
CO2: 20 mEq/L (ref 19–32)
CO2: 27 mEq/L (ref 19–32)
Chloride: 103 mEq/L (ref 96–112)
Chloride: 104 mEq/L (ref 96–112)
Creatinine, Ser: 0.53 mg/dL (ref 0.4–1.2)
GFR calc Af Amer: >60 mL/min (ref >60)
GFR calc non Af Amer: >60 mL/min (ref >60)
GFR calc non Af Amer: >60 mL/min (ref >60)
Glucose, Bld: 71 mg/dL (ref 70–99)
Glucose, Bld: 74 mg/dL (ref 70–99)
Potassium: 3.6 mEq/L (ref 3.5–5.1)
Potassium: 3.9 mEq/L (ref 3.5–5.1)
Sodium: 133 mEq/L — ABNORMAL LOW (ref 135–145)
Sodium: 135 mEq/L (ref 135–145)
Total Bilirubin: 0.2 mg/dL — ABNORMAL LOW (ref 0.3–1.2)
Total Bilirubin: 0.3 mg/dL (ref 0.3–1.2)
Total Protein: 6.4 g/dL (ref 6.0–8.3)
Total Protein: 6.6 g/dL (ref 6.0–8.3)

## 2011-06-02 LAB — LACTATE DEHYDROGENASE
LDH: 121 U/L (ref 94–250)
LDH: 122 U/L (ref 94–250)

## 2011-06-02 LAB — RPR: RPR Ser Ql: NONREACTIVE

## 2011-06-02 LAB — URIC ACID: Uric Acid, Serum: 3.6 mg/dL (ref 2.4–7.0)

## 2011-07-31 ENCOUNTER — Telehealth: Payer: Self-pay | Admitting: Family Medicine

## 2011-07-31 NOTE — Telephone Encounter (Signed)
Okay to work in

## 2011-07-31 NOTE — Telephone Encounter (Signed)
Pt scheduled for 08/08/11 . She was seen in urgent care about a week ago and was told she had bronchitis, pt is not getting any better and would like to be worked in sooner. She will be out of town until wed. But could come in after that. Please advise

## 2011-08-02 ENCOUNTER — Encounter: Payer: Self-pay | Admitting: Family Medicine

## 2011-08-02 ENCOUNTER — Ambulatory Visit (INDEPENDENT_AMBULATORY_CARE_PROVIDER_SITE_OTHER): Payer: Commercial Managed Care - PPO | Admitting: Family Medicine

## 2011-08-02 VITALS — BP 120/84 | Temp 98.5°F | Wt 122.0 lb

## 2011-08-02 DIAGNOSIS — G43819 Other migraine, intractable, without status migrainosus: Secondary | ICD-10-CM

## 2011-08-02 DIAGNOSIS — Z23 Encounter for immunization: Secondary | ICD-10-CM

## 2011-08-02 DIAGNOSIS — J069 Acute upper respiratory infection, unspecified: Secondary | ICD-10-CM

## 2011-08-02 MED ORDER — HYDROCODONE-HOMATROPINE 5-1.5 MG/5ML PO SYRP: ORAL_SOLUTION | ORAL | Status: DC

## 2011-08-02 MED ORDER — PREDNISONE 20 MG PO TABS: ORAL_TABLET | ORAL | Status: DC

## 2011-08-02 MED ORDER — SCOPOLAMINE 1 MG/3DAYS TD PT72: 1.0000 | MEDICATED_PATCH | TRANSDERMAL | Status: DC

## 2011-08-02 NOTE — Progress Notes (Signed)
Addended by: Kern Reap B on: 08/02/2011 11:13 AM   Modules accepted: Orders

## 2011-08-02 NOTE — Patient Instructions (Signed)
Take the prednisone as directed......... 20 mg daily for 5 days, 10 mg daily for 5 days, then 10 mg Monday, Wednesday, Friday, for a two week taper.  Drink lots of water.  Hydromet one half to 1 teaspoon at bedtime p.r.n. Cough.  Return p.r.n.

## 2011-08-02 NOTE — Progress Notes (Signed)
  Subjective:    Patient ID: [name], female    DOB: 1981-06-06, 30 y.o.   MRN: [id number]  HPICrystal is a 30 year old, married female, G2, P2, full-time mom, nonsmoker, who comes in today with a 3 week history of cough.  She states around November, the 18th, she developed a cough.  No other symptoms.  On the 20th she went to an urgent care and random and and was given an antibiotic steroids.  Cough syrup and Tamiflu.  She did not take the antibiotics, prednisone and Tamiflu.  However, she has been taking a cough syrup.  She felt better until yesterday when the cough came back.  No fever, chills, no sputum production.    Review of Systems General ENT and pulmonary venous systems otherwise negative.  She continues to see her dermatologist every 4 months because of a history of dysplastic nevi    Objective:   Physical Exam  Well-developed well-nourished, female, in no acute distress.  Examination of the HEENT were negative.  Neck was supple.  No adenopathy.  Lungs were clear.  Skin exam was confined to the face, back, chest, and abdomen all of which appeared normal.  Scars from previous small removal were evident      Assessment & Plan:  Viral syndrome treat symptomatically with lots of liquids, cough syrup, short course of prednisone.  Return p.r.n.

## 2011-08-08 ENCOUNTER — Ambulatory Visit: Payer: Commercial Managed Care - PPO | Admitting: Family Medicine

## 2011-08-29 DIAGNOSIS — B029 Zoster without complications: Secondary | ICD-10-CM

## 2011-08-29 HISTORY — DX: Zoster without complications: B02.9

## 2011-10-02 ENCOUNTER — Encounter: Payer: Self-pay | Admitting: Family Medicine

## 2011-10-02 ENCOUNTER — Ambulatory Visit (INDEPENDENT_AMBULATORY_CARE_PROVIDER_SITE_OTHER): Payer: Commercial Managed Care - PPO | Admitting: Family Medicine

## 2011-10-02 DIAGNOSIS — G43819 Other migraine, intractable, without status migrainosus: Secondary | ICD-10-CM

## 2011-10-02 DIAGNOSIS — J069 Acute upper respiratory infection, unspecified: Secondary | ICD-10-CM

## 2011-10-02 DIAGNOSIS — M26609 Unspecified temporomandibular joint disorder, unspecified side: Secondary | ICD-10-CM

## 2011-10-02 MED ORDER — HYDROCODONE-ACETAMINOPHEN 7.5-750 MG PO TABS: ORAL_TABLET | ORAL | Status: DC

## 2011-10-02 MED ORDER — HYDROCODONE-HOMATROPINE 5-1.5 MG/5ML PO SYRP: ORAL_SOLUTION | ORAL | Status: DC

## 2011-10-02 MED ORDER — CYCLOBENZAPRINE HCL 5 MG PO TABS: ORAL_TABLET | ORAL | Status: DC

## 2011-10-02 NOTE — Progress Notes (Signed)
  Subjective:    Patient ID: Conley Rolls, female    DOB: 01-23-1981, 31 y.o.   MRN: [id number]  HPI Canisha is a 31 year old married female nonsmoker who comes in today for evaluation of 2 problems  Over the weekend she developed a fever chills or headache postnasal drip nonproductive cough. She went to the [place] urgent care and without any diagnostic studies was given amoxicillin. She comes in today feeling no better. She states prior to getting her tonsils out years ago she had recurrent strep throats. She recalls that are usually a days worth of amoxicillin and her symptoms went away.  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 She takes a half of Flexeril and Vicodin at bedtime as needed for headaches   Review of Systems    general and pulmonary review of systems otherwise negative Objective:   Physical Exam Well-developed well-nourished female in no acute distress HEENT negative neck was supple ,,,, diffuse cervical adenopathy lungs are clear       Assessment & Plan:  Viral syndrome viral treat symptomatically

## 2011-10-02 NOTE — Patient Instructions (Signed)
Stop the amoxicillin  Do the things we talked about for your viral infection including drinking lots of liquids, vaporizer in her bedroom at night, Hydromet 1/2-1 teaspoon at bedtime when necessary for cough  For the headaches you can take a half of a Flexeril and a half of a Vicodin at bedtime as needed  Return when necessary

## 2011-12-26 ENCOUNTER — Telehealth: Payer: Self-pay | Admitting: Family Medicine

## 2011-12-26 DIAGNOSIS — M26609 Unspecified temporomandibular joint disorder, unspecified side: Secondary | ICD-10-CM

## 2011-12-26 NOTE — Telephone Encounter (Signed)
Pt called req refill of HYDROcodone-acetaminophen (VICODIN ES) 7.5-750 MG [facility] [place] [place]

## 2011-12-26 NOTE — Telephone Encounter (Signed)
Tried to call but there is a voice mail that has not been set up yet

## 2011-12-26 NOTE — Telephone Encounter (Signed)
Fleet Contras please call,,,,,,,,,,, we had given her a 90 day prescription for the Vicodin because she was can have TMJ surgery. When is  her surgeries?????????

## 2011-12-27 MED ORDER — HYDROCODONE-ACETAMINOPHEN 7.5-750 MG PO TABS: ORAL_TABLET | ORAL | Status: DC

## 2011-12-27 NOTE — Telephone Encounter (Signed)
Fleet Contras please call,,,,,,,,,, she has used 90 Vicodin tablets in 2 months therefore this indicates that her migraines are not under good control. The options at this point are to come in and sit down and talk about other options or if she wishes we can get her set up to see Dr. Modesto Charon the neurologist. Molli Knock for 30 Vicodin tablets no refills to give her time to make a decision what she liked to do

## 2011-12-27 NOTE — Telephone Encounter (Signed)
Patient takes Vicodin for migraine headaches.  Is this okay to fill?

## 2011-12-27 NOTE — Telephone Encounter (Signed)
Spoke with patient and she is working now and unable to come in right away.  Her surgery is not scheduled at this time because it is not covered by insurance and the cost would be $20.000.  Patient is aware that she will need an office visit for more refills.

## 2012-01-12 ENCOUNTER — Encounter: Payer: Self-pay | Admitting: Family Medicine

## 2012-01-12 ENCOUNTER — Ambulatory Visit (INDEPENDENT_AMBULATORY_CARE_PROVIDER_SITE_OTHER): Payer: Commercial Managed Care - PPO | Admitting: Family Medicine

## 2012-01-12 VITALS — BP 110/78 | Temp 98.9°F | Wt 124.0 lb

## 2012-01-12 DIAGNOSIS — M26609 Unspecified temporomandibular joint disorder, unspecified side: Secondary | ICD-10-CM

## 2012-01-12 DIAGNOSIS — G43819 Other migraine, intractable, without status migrainosus: Secondary | ICD-10-CM

## 2012-01-12 MED ORDER — HYDROCODONE-ACETAMINOPHEN 7.5-750 MG PO TABS: ORAL_TABLET | ORAL | Status: DC

## 2012-01-12 MED ORDER — DIAZEPAM 5 MG PO TABS: ORAL_TABLET | ORAL | Status: DC

## 2012-01-12 MED ORDER — TRAMADOL HCL 50 MG PO TABS: ORAL_TABLET | ORAL | Status: DC

## 2012-01-12 NOTE — Patient Instructions (Signed)
Motrin 600 mg twice daily with food........... however if you develop abdominal cramping any GI symptom then stop the Motrin  Tramadol 50 mg,,,,,,,,,,, one half to one tablet twice daily as needed for pain  , Valium 5 mg,,,,,,,,,,,,, one half tablet each bedtime  If the combination of the Valium and tramadol is not controlling her pain and take a half of a Vicodin at bedtime

## 2012-01-12 NOTE — Progress Notes (Signed)
  Subjective:    Patient ID: [name], female    DOB: May 22, 1981, 31 y.o.   MRN: [id number]  HPI [name] is a 31 year old married female nonsmoker who comes in today for evaluation of headaches  She has underlying orthodontic difficulties that are going to require oral surgery for correction. However the surgery as $20,000 out of pocket and therefore it may take her year to save enough money to have the surgery in the meantime she is requesting we treat her for pain   Review of Systems    general review of systems otherwise negative Objective:   Physical Exam  Well-developed well-nourished female in no acute distress      Assessment & Plan:  Chronic headaches secondary to underlying orthodontic difficulties plan Valium 5 mg one half tab each bedtime, tramadol 50 mg 1/2-1 tablet twice a day when necessary backup Vicodin each bedtime when necessary for severe pain Motrin 600 mg twice a day with food

## 2012-03-26 ENCOUNTER — Telehealth: Payer: Self-pay | Admitting: Family Medicine

## 2012-03-26 MED ORDER — HYDROCODONE-ACETAMINOPHEN 7.5-750 MG PO TABS
1.0000 | ORAL_TABLET | ORAL | Status: DC | PRN
Start: 2012-03-26 — End: 2012-05-16

## 2012-03-26 NOTE — Telephone Encounter (Signed)
Insurance will only pay for 15 tabs with directions qhs.  New rx called in for every 4-6 #30.

## 2012-03-26 NOTE — Telephone Encounter (Signed)
Caller: [name]/Patient; PCP: Roderick Pee.; CB#: [phone]; Call regarding has RX Hydrocodone 750 Mg for Migraines and Jaw Pain.  Had Nose Surgery (septoplasty for deviated septum) on 03/18/12. Asking If Hydrocodone ordered by Dr Tawanna Cooler Could Be Reduced d/t Dose Is Too Strong and needs to be used several times during the day.;  ENT, Dr Jenne Pane, gave Percocet 5/325 and Hydrocodone 5/325 that were too stong.  Tramadol is not strong enough.  Also taking Ibuprofen.  Had post op appt 03/22/12 with Dr Jenne Pane who said jaw pain may last another week and r/t jaw thrust open for ET tube.  LMP: 03/18/12. BCP. Afebrile.  Reports sharp pain in lower jaw; pain worse on the R side; pain rated 6-7/10.  Denies nose pain.  Info noted and sent to [facility] for call back for pain uncontrolled and not taking pain med as directed per Postoperative Problems Guideline.  [facility] [phone].

## 2012-03-26 NOTE — Telephone Encounter (Signed)
Fleet Contras please call she could use a combination of Motrin 600 mg twice daily with food and the Vicodin one half tablet or one quarter tablet every 4-6 hours as needed for pain

## 2012-03-31 ENCOUNTER — Other Ambulatory Visit: Payer: Self-pay | Admitting: Family Medicine

## 2012-05-13 ENCOUNTER — Other Ambulatory Visit: Payer: Self-pay | Admitting: *Deleted

## 2012-05-13 DIAGNOSIS — M26609 Unspecified temporomandibular joint disorder, unspecified side: Secondary | ICD-10-CM

## 2012-05-13 DIAGNOSIS — G43819 Other migraine, intractable, without status migrainosus: Secondary | ICD-10-CM

## 2012-05-13 NOTE — Telephone Encounter (Signed)
Patient would like a refill of vicodin.  Denied per Dr Tawanna Cooler. Okay to fill Tramadol only if needed.  Spoke to pharmacy.

## 2012-05-16 ENCOUNTER — Encounter: Payer: Self-pay | Admitting: Family Medicine

## 2012-05-16 ENCOUNTER — Ambulatory Visit (INDEPENDENT_AMBULATORY_CARE_PROVIDER_SITE_OTHER): Payer: Commercial Managed Care - PPO | Admitting: Family Medicine

## 2012-05-16 VITALS — BP 120/80 | Temp 98.7°F | Wt 125.0 lb

## 2012-05-16 DIAGNOSIS — G43819 Other migraine, intractable, without status migrainosus: Secondary | ICD-10-CM

## 2012-05-16 DIAGNOSIS — M26609 Unspecified temporomandibular joint disorder, unspecified side: Secondary | ICD-10-CM

## 2012-05-16 DIAGNOSIS — Z23 Encounter for immunization: Secondary | ICD-10-CM

## 2012-05-16 MED ORDER — HYDROCODONE-ACETAMINOPHEN 7.5-750 MG PO TABS: ORAL_TABLET | ORAL | Status: DC

## 2012-05-16 MED ORDER — TRAMADOL HCL 50 MG PO TABS: ORAL_TABLET | ORAL | Status: DC

## 2012-05-16 MED ORDER — RIZATRIPTAN BENZOATE 5 MG PO TABS: 5.0000 mg | ORAL_TABLET | ORAL | Status: DC | PRN

## 2012-05-16 NOTE — Progress Notes (Signed)
  Subjective:    Patient ID: [name], female    DOB: [birth year], 31 y.o.   MRN: [id number]  HPI [name] is a 31 year old married female nonsmoker who comes in to discuss pain management  She has a history of TMJ syndrome and the costs for the perceived her to correct her problem is over $20,000 which she doesn't have. She has been treating her pain with tramadol one tablet twice daily during the day and Vicodin 1 tablet at bedtime.  She had a septoplasty and a bone spur removed this summer by Dr. Jenne Pane ENT  She also has migraine headaches that she thinks her triggered by her jaw pain. This occurs at least once a week. For the migraine headache she'll typically take some Motrin tramadol and Vicodin and go to bed  She gets routine GYN followup also [facility] because of dysplastic nevi    Review of Systems Gen. review of systems otherwise negative    Objective:   Physical Exam  Well-developed well-nourished female no acute distress      Assessment & Plan:  Chronic pain secondary to TMJ plan continue tramadol 50 twice a day during the day Vicodin one at bedtime  Migraine headaches trial of Maxalt  Patient agreed to and signed a pain contract

## 2012-05-16 NOTE — Patient Instructions (Addendum)
Take the tramadol twice daily during the day for pain  Vicodin one at bedtime  Maxalt one staff at the onset of migraine headaches  Return when necessary

## 2012-07-02 ENCOUNTER — Telehealth: Payer: Self-pay | Admitting: Family Medicine

## 2012-07-02 NOTE — Telephone Encounter (Signed)
Per Dr Tawanna Cooler patient should call her OB for more pain pills for endometriosis

## 2012-07-02 NOTE — Telephone Encounter (Signed)
Pt has to have surgery re: Endometriosis and pt is going to need a script for pain med for cramps.HYDROcodone-acetaminophen (VICODIN ES) 7.5-750 MG per tablet.  Pt will need extra med callled for the cramps, since she already taking 1 pill a day for jaw pain.

## 2012-07-03 NOTE — Telephone Encounter (Signed)
Patient states she has signed a pain contract and should only get her Vicodin from Dr Tawanna Cooler.  Okay to fill?

## 2012-07-04 NOTE — Telephone Encounter (Signed)
Pt called wanting to know Dr Nelida Meuse decision on her pain meds..  Thanks!

## 2012-07-04 NOTE — Telephone Encounter (Signed)
Fleet Contras please call and find out who her OB is we will coordinate her pain treatment with her obstetrician

## 2012-07-04 NOTE — Telephone Encounter (Signed)
Spoke with Dr Vance Gather nurse and [name] was seen 06/21/12.  They discussed options for surgery, but no date given, and no pain meds given to patient.  Okay to refill Vicodin?

## 2012-07-04 NOTE — Telephone Encounter (Signed)
Dr Lowe 

## 2012-07-05 MED ORDER — HYDROCODONE-ACETAMINOPHEN 7.5-750 MG PO TABS: 1.0000 | ORAL_TABLET | Freq: Two times a day (BID) | ORAL | Status: DC

## 2012-07-05 NOTE — Telephone Encounter (Signed)
Pt called to check on status of getting pain med. Pt req call back today.

## 2012-07-05 NOTE — Telephone Encounter (Signed)
I spoke with [name] and she understood that we can only call in Hydrocodone BID for one time until she sees Dr Rana Snare.  If she goes through the surgery her pain management at that time should be taken care of by Dr Rana Snare.

## 2012-08-02 ENCOUNTER — Other Ambulatory Visit: Payer: Self-pay | Admitting: *Deleted

## 2012-08-02 MED ORDER — HYDROCODONE-ACETAMINOPHEN 7.5-325 MG PO TABS: 1.0000 | ORAL_TABLET | Freq: Two times a day (BID) | ORAL | Status: DC

## 2012-08-05 ENCOUNTER — Ambulatory Visit (INDEPENDENT_AMBULATORY_CARE_PROVIDER_SITE_OTHER): Payer: Commercial Managed Care - PPO | Admitting: Family Medicine

## 2012-08-05 ENCOUNTER — Encounter: Payer: Self-pay | Admitting: Family Medicine

## 2012-08-05 VITALS — BP 124/80 | Temp 98.8°F | Wt 126.0 lb

## 2012-08-05 DIAGNOSIS — N809 Endometriosis, unspecified: Secondary | ICD-10-CM

## 2012-08-05 DIAGNOSIS — J069 Acute upper respiratory infection, unspecified: Secondary | ICD-10-CM

## 2012-08-05 MED ORDER — HYDROCODONE-ACETAMINOPHEN 7.5-325 MG PO TABS: 1.0000 | ORAL_TABLET | Freq: Two times a day (BID) | ORAL | Status: DC

## 2012-08-05 MED ORDER — HYDROCODONE-HOMATROPINE 5-1.5 MG/5ML PO SYRP: ORAL_SOLUTION | ORAL | Status: DC

## 2012-08-05 NOTE — Patient Instructions (Signed)
For your viral infection drink lots of liquids Hydromet 1/2-1 teaspoon each bedtime when necessary for cough and cold  Return when necessary

## 2012-08-05 NOTE — Progress Notes (Signed)
  Subjective:    Patient ID: [name], female    DOB: Jul 10, 1981, 31 y.o.   MRN: [id number]  HPI [name] is a 31 year old married female nonsmoker who comes in today for evaluation of a cough and sore throat  She states last Thursday she developed a sore throat. She went with her child to see Dr. Jeris Penta EMT. They did a strep test that was negative. Yesterday she developed a cough. No fever chills earache etc.  Her father was recently diagnosed by me to have a renal cell cancer surgery and pending December 26. [name] is supposed to have surgery for endometriosis this month however since her father's diagnosis she has put her surgery on the back burner. Friday she got a refill of her Vicodin which will be for one month. I will go ahead and tender another prescription to get her through January until she can have her surgery   Review of Systems    June review of systems negative no history of asthma pneumonia Objective:   Physical Exam  Well-developed well nourished female in no acute distress HEENT negative neck was supple no adenopathy lungs are clear      Assessment & Plan:  Viral syndrome plan increase fluid intake Hydromet each bedtime when necessary for cough return when necessary

## 2012-08-06 ENCOUNTER — Ambulatory Visit: Payer: Commercial Managed Care - PPO | Admitting: Family Medicine

## 2012-09-10 ENCOUNTER — Ambulatory Visit (INDEPENDENT_AMBULATORY_CARE_PROVIDER_SITE_OTHER): Payer: Commercial Managed Care - PPO | Admitting: Family Medicine

## 2012-09-10 ENCOUNTER — Encounter: Payer: Self-pay | Admitting: Family Medicine

## 2012-09-10 VITALS — BP 120/80 | Temp 99.7°F | Ht 67.0 in | Wt 128.0 lb

## 2012-09-10 DIAGNOSIS — G43819 Other migraine, intractable, without status migrainosus: Secondary | ICD-10-CM

## 2012-09-10 DIAGNOSIS — G43119 Migraine with aura, intractable, without status migrainosus: Secondary | ICD-10-CM

## 2012-09-10 DIAGNOSIS — M26609 Unspecified temporomandibular joint disorder, unspecified side: Secondary | ICD-10-CM

## 2012-09-10 DIAGNOSIS — N809 Endometriosis, unspecified: Secondary | ICD-10-CM | POA: Insufficient documentation

## 2012-09-10 DIAGNOSIS — K219 Gastro-esophageal reflux disease without esophagitis: Secondary | ICD-10-CM

## 2012-09-10 MED ORDER — HYDROCODONE-ACETAMINOPHEN 7.5-325 MG PO TABS: ORAL_TABLET | ORAL | Status: DC

## 2012-09-10 MED ORDER — DIAZEPAM 5 MG PO TABS
ORAL_TABLET | ORAL | Status: DC
Start: 2012-09-10 — End: 2013-06-23

## 2012-09-10 MED ORDER — TRAMADOL HCL 50 MG PO TABS: ORAL_TABLET | ORAL | Status: DC

## 2012-09-10 MED ORDER — RIZATRIPTAN BENZOATE 5 MG PO TABS: 5.0000 mg | ORAL_TABLET | ORAL | Status: DC | PRN

## 2012-09-10 NOTE — Patient Instructions (Signed)
Continue your current medications  Purchase a short arm splint for your right and left hand and wear it at bedtime for 2-3 weeks for the carpal tunnel syndrome  SPF 50 sunscreens and hats to protect her skin  Monthly skin and breast exams  Return in one year sooner if any problems

## 2012-09-10 NOTE — Progress Notes (Signed)
Subjective:    Patient ID: [name], female    DOB: 03-08-1981, 32 y.o.   MRN: [id number]  HPI [name] is a 32 year old married female nonsmoker G2 P2 on BCPs from her GYN who comes in today for general medical examination  Her father was seen by me last December with flank pain. It turns out it was a renal cell carcinoma. He subsequently had surgery December 26 and [name] tells me that he was told he does not have but 6-8 months to live he has a very aggressive type of renal cell carcinoma. He's due to get a medical oncology opinion here at [facility] soon,. Because of this she no longer works she staying home taking care of her family. She does have endometriosis and TMJ syndrome. She's had to increase her Vicodin to 3 times daily. She states the tramadol gives her a fuzzy feeling in the head and therefore she would prefer not to take the tramadol.  She takes Valium each bedtime when necessary for anxiety and sleep dysfunction, Motrin 600 twice a day for TMJ, her BCPs, Zantac 150 mg daily OTC, Maxalt when necessary for migraine headaches.  She also some tingling in the fourth and fifth fingers of her right and left hands. She was referred to rheumatologist they cannot find any particular underlying problem. It sounds like she has some mild carpal tunnel syndrome.  She has light skin and light eyes and has dysplastic nevi removed. She sees her dermatologist yearly   Review of Systems  Constitutional: Negative.   HENT: Negative.   Eyes: Negative.   Respiratory: Negative.   Cardiovascular: Negative.   Gastrointestinal: Negative.   Genitourinary: Negative.   Musculoskeletal: Negative.   Neurological: Negative.   Hematological: Negative.   Psychiatric/Behavioral: Negative.        Objective:   Physical Exam  Constitutional: She appears well-developed and well-nourished.  HENT:  Head: Normocephalic and atraumatic.  Right Ear: External ear normal.  Left Ear: External ear normal.   Nose: Nose normal.  Mouth/Throat: Oropharynx is clear and moist.  Eyes: EOM are normal. Pupils are equal, round, and reactive to light.  Neck: Normal range of motion. Neck supple. No thyromegaly present.  Cardiovascular: Normal rate, regular rhythm, normal heart sounds and intact distal pulses.  Exam reveals no gallop and no friction rub.   No murmur heard. Pulmonary/Chest: Effort normal and breath sounds normal.  Abdominal: Soft. Bowel sounds are normal. She exhibits no distension and no mass. There is no tenderness. There is no rebound.  Genitourinary:       Bilateral breast exam normal pectus deformity  Musculoskeletal: Normal range of motion.  Lymphadenopathy:    She has no cervical adenopathy.  Neurological: She is alert. She has normal reflexes. No cranial nerve deficit. She exhibits normal muscle tone. Coordination normal.  Skin: Skin is warm and dry.       Total body skin exam normal except for scars from previous lesions that were removed. She knows how to do monthly skin exams at home  Psychiatric: She has a normal mood and affect. Her behavior is normal. Judgment and thought content normal.          Assessment & Plan:  Healthy female  Pain secondary to endometriosis and TMJ syndrome continue Vicodin 3 times a day and BCPs  Anxiety related to her father having cancer Valium one half tab each bedtime when necessary  Motrin 600 twice a day for gel pain  Zantac 150 twice a day  Maxalt when necessary for migraine headaches  Short arm splints for the carpal tunnel syndrome.

## 2012-11-21 ENCOUNTER — Telehealth: Payer: Self-pay | Admitting: *Deleted

## 2012-11-21 NOTE — Telephone Encounter (Signed)
Patient is calling because she is going to see Dr Rana Snare today and she will have him fill her pain medication.   If you have any questions please call [phone]

## 2012-11-25 ENCOUNTER — Encounter: Payer: Self-pay | Admitting: *Deleted

## 2012-11-25 NOTE — Telephone Encounter (Signed)
Patient saw Dr Rana Snare and they also found a kiwi size cyst on the right ovary.  A prescription was written for hydrocodone.

## 2012-11-26 ENCOUNTER — Telehealth: Payer: Self-pay | Admitting: *Deleted

## 2012-11-26 NOTE — Telephone Encounter (Signed)
Dr Rana Snare gave [name] enough medication until her surgery to be schedule April 24, 29, or 30.  Dr Rana Snare wanted Dr Tawanna Cooler to fill the medication after the surgery.  She would like Oxycodone #20 and Hydrocodone 1 every 4-6 hours for pain.  She would like to keep her office visit to clarify these prescriptions if needed.

## 2012-11-29 NOTE — Telephone Encounter (Signed)
Spoke with Dr Vance Gather nurse and she will discuss the medication with the patient today at the office

## 2012-12-04 ENCOUNTER — Encounter: Payer: Self-pay | Admitting: Family Medicine

## 2012-12-04 ENCOUNTER — Ambulatory Visit (INDEPENDENT_AMBULATORY_CARE_PROVIDER_SITE_OTHER): Payer: 59 | Admitting: Family Medicine

## 2012-12-04 DIAGNOSIS — M26609 Unspecified temporomandibular joint disorder, unspecified side: Secondary | ICD-10-CM

## 2012-12-04 DIAGNOSIS — N83201 Unspecified ovarian cyst, right side: Secondary | ICD-10-CM | POA: Insufficient documentation

## 2012-12-04 DIAGNOSIS — N83209 Unspecified ovarian cyst, unspecified side: Secondary | ICD-10-CM

## 2012-12-04 DIAGNOSIS — N809 Endometriosis, unspecified: Secondary | ICD-10-CM

## 2012-12-04 MED ORDER — HYDROCODONE-ACETAMINOPHEN 7.5-325 MG PO TABS: ORAL_TABLET | ORAL | Status: DC

## 2012-12-04 MED ORDER — OXYCODONE-ACETAMINOPHEN 10-325 MG PO TABS: ORAL_TABLET | ORAL | Status: DC

## 2012-12-04 NOTE — Patient Instructions (Addendum)
Take the medication as directed,,,,,,,,,,,,,,,,,,,,,,,, call me if you have any problems

## 2012-12-04 NOTE — Progress Notes (Signed)
  Subjective:    Patient ID: [name], female    DOB: [birth year], 32 y.o.   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 Her most pressing problem now about is that she is scheduled for a removal of a cyst on the right ovary on April 24. Her GYN has referred her pain treatment back to [place].  She states last summer she also had nasal surgery by Dr. Jenne Pane in the first 3 days she took Percocet and then switched to the Vicodin and that seem to help relieve her pain.  Recently she also had a bout of shingles. She had a painful blister on her right upper anterior chest wall. She went to see her dermatologist to diagnose shingles. The rash did not spread. The rash is gone however she still has occasional tingling in her right arm but it is intermittent not chronic  Her dad also had his right kidney removed this past year for renal cell carcinoma he seems to be doing well   Review of Systems    review of systems otherwise negative Objective:   Physical Exam  Well-developed well-nourished female no acute distress examination the chest wall shows a resolving rash abdominal exam shows a palpable lesion right lower quadrant consistent with an extremely large right ovarian cyst      Assessment & Plan:  Right ovarian cyst surgery April 24 manage pain with Percocet for 4-5 days then switch to the Vicodin

## 2013-02-18 ENCOUNTER — Other Ambulatory Visit: Payer: Self-pay | Admitting: *Deleted

## 2013-02-18 DIAGNOSIS — N809 Endometriosis, unspecified: Secondary | ICD-10-CM

## 2013-02-18 MED ORDER — HYDROCODONE-ACETAMINOPHEN 7.5-325 MG PO TABS: ORAL_TABLET | ORAL | Status: DC

## 2013-02-20 ENCOUNTER — Telehealth: Payer: Self-pay | Admitting: *Deleted

## 2013-02-20 NOTE — Telephone Encounter (Signed)
Per Dr Tawanna Cooler patient does not need an office visit but should keep [place] informed

## 2013-02-20 NOTE — Telephone Encounter (Signed)
Patient is aware and will call when it is time for a refill

## 2013-02-20 NOTE — Telephone Encounter (Signed)
Patient is calling to see if she will need to schedule an office visit because she will need to continue to take Hydrocodone every 4 hours for her TMJ pain?

## 2013-03-05 ENCOUNTER — Other Ambulatory Visit: Payer: Self-pay | Admitting: *Deleted

## 2013-03-05 DIAGNOSIS — N809 Endometriosis, unspecified: Secondary | ICD-10-CM

## 2013-03-05 MED ORDER — HYDROCODONE-ACETAMINOPHEN 7.5-325 MG PO TABS: ORAL_TABLET | ORAL | Status: DC

## 2013-03-18 ENCOUNTER — Telehealth: Payer: Self-pay | Admitting: Family Medicine

## 2013-03-18 DIAGNOSIS — N809 Endometriosis, unspecified: Secondary | ICD-10-CM

## 2013-03-18 NOTE — Telephone Encounter (Signed)
Pt states she needs her meds and you should know what she means.

## 2013-03-19 MED ORDER — HYDROCODONE-ACETAMINOPHEN 7.5-325 MG PO TABS: ORAL_TABLET | ORAL | Status: DC

## 2013-03-19 NOTE — Telephone Encounter (Signed)
PT called and stated that she needed a refill of HYDROcodone-acetaminophen (NORCO) 7.5-325 MG per tablet. Please assist.

## 2013-04-16 ENCOUNTER — Telehealth: Payer: Self-pay | Admitting: Family Medicine

## 2013-04-16 DIAGNOSIS — N809 Endometriosis, unspecified: Secondary | ICD-10-CM

## 2013-04-16 NOTE — Telephone Encounter (Signed)
PT is calling to request a refill of her HYDROcodone-acetaminophen (NORCO) 7.5-325 MG per tablet, [facility] [place]. Please assist.

## 2013-04-17 MED ORDER — HYDROCODONE-ACETAMINOPHEN 7.5-325 MG PO TABS: ORAL_TABLET | ORAL | Status: DC

## 2013-04-17 NOTE — Telephone Encounter (Signed)
Patient calling back to check status. I reminded her that our policy is 72 hrs. She is just worried that it won't be done before the weekend, & then she'll be out. I assured her we would do it as soon as we could.

## 2013-05-14 ENCOUNTER — Telehealth: Payer: Self-pay | Admitting: Family Medicine

## 2013-05-14 DIAGNOSIS — N809 Endometriosis, unspecified: Secondary | ICD-10-CM

## 2013-05-14 NOTE — Telephone Encounter (Signed)
Pt request refill of HYDROcodone-acetaminophen (NORCO) 7.5-325 MG per tablet [facility] [place] [place]

## 2013-05-15 MED ORDER — HYDROCODONE-ACETAMINOPHEN 7.5-325 MG PO TABS: ORAL_TABLET | ORAL | Status: DC

## 2013-05-15 NOTE — Telephone Encounter (Signed)
Pt called to state that she will run out of her medication this weekend. FYI.

## 2013-06-05 ENCOUNTER — Ambulatory Visit (INDEPENDENT_AMBULATORY_CARE_PROVIDER_SITE_OTHER): Payer: 59 | Admitting: Family Medicine

## 2013-06-05 ENCOUNTER — Encounter: Payer: Self-pay | Admitting: Family Medicine

## 2013-06-05 VITALS — BP 140/98 | Temp 98.5°F | Wt 136.0 lb

## 2013-06-05 DIAGNOSIS — M542 Cervicalgia: Secondary | ICD-10-CM

## 2013-06-05 DIAGNOSIS — Z23 Encounter for immunization: Secondary | ICD-10-CM

## 2013-06-05 DIAGNOSIS — N809 Endometriosis, unspecified: Secondary | ICD-10-CM

## 2013-06-05 DIAGNOSIS — M26609 Unspecified temporomandibular joint disorder, unspecified side: Secondary | ICD-10-CM

## 2013-06-05 MED ORDER — HYDROCODONE-ACETAMINOPHEN 7.5-325 MG PO TABS: ORAL_TABLET | ORAL | Status: DC

## 2013-06-05 NOTE — Patient Instructions (Signed)
We will set you up a neurologic consult for further evaluation of the neck hand and overall pain control

## 2013-06-05 NOTE — Progress Notes (Signed)
  Subjective:    Patient ID: [name], female    DOB: [birth year], 32 y.o.   MRN: [id number]  HPI [name] is a delightful 32 year old married female nonsmoker who comes in today for evaluation of jaw neck and facial pain  She's had a long-standing history of TMJ. She is a Armed forces operational officer. She's been working with her general Investment banker, corporate. She has mainly trouble with the right TMJ.  She's taken Vicodin one tablet 4 times daily  She also some tingling in her fourth and fifth fingers of her left hand to come and go.  A week or so ago she has severe sharp pain in her neck that shot down her left arm. The pain resolved.   Review of Systems    review of systems negative Objective:   Physical Exam  Well-developed well-nourished female no acute distress vital signs stable she is afebrile except for BP 140/98      Assessment & Plan:  TMJ followup with oral surgery  Numbness fourth and fifth fingers left and right hand along with neck pain neuro consult with Dr. Allena Katz

## 2013-06-23 ENCOUNTER — Encounter: Payer: Self-pay | Admitting: Neurology

## 2013-06-23 ENCOUNTER — Ambulatory Visit (INDEPENDENT_AMBULATORY_CARE_PROVIDER_SITE_OTHER): Payer: 59 | Admitting: Neurology

## 2013-06-23 VITALS — BP 140/88 | HR 80 | Temp 98.4°F | Resp 16 | Ht 66.0 in | Wt 136.7 lb

## 2013-06-23 DIAGNOSIS — G5620 Lesion of ulnar nerve, unspecified upper limb: Secondary | ICD-10-CM

## 2013-06-23 DIAGNOSIS — G5621 Lesion of ulnar nerve, right upper limb: Secondary | ICD-10-CM

## 2013-06-23 DIAGNOSIS — G5622 Lesion of ulnar nerve, left upper limb: Secondary | ICD-10-CM

## 2013-06-23 NOTE — Progress Notes (Signed)
[facility] Neurology Division Clinic Note - Initial Visit   Date: 06/23/2013    Kellie [name] MRN: [id number] DOB: 28-Nov-1980   Dear Dr Kellie [name]:  Thank you for your kind referral of Kellie [name] for consultation of bilateral tingling of her arms. Although her history is well known to you, please allow [place] to reiterate it for the purpose of our medical record. The patient was accompanied to the clinic by self.   History of Present Illness: Kellie [name] is a 32 y.o. year-old right-handed Caucasian [name] with history of [name], Kellie [name], Kellie [name] presenting for evaluation of tingling sensation of her hands.  Since August 2011, she has bilateral constant tingling > numbness over the 4th Kellie 5th fingers, which can become more severe at night time or if she is holding something or with arms flexed.  Symptoms are slightly improved if she keeps the arm straight.  She reports having a cyst on right distal forearm that does not cause any [name].  She also complains of intermittent sharp [name] at the hypothenar eminence of the right hand.  Sensory complaints were relatively stable over the past several years however in early October 2014, she was painting a headboard for her daughter's room Kellie developed sharp [name] in the back of her head with associated burning [name] into her entire left arm.  Symptom have been occuring once every 2-3 days, lasting 30-min. Denies any alleviating or exacerbating factors.    Of note, she has Kellie facial [name] related to [name] dating back to 2009 when she was pregnant with her first child.  She was working as a Armed forces operational officer Kellie started developing tension headaches two months into her first pregnancy Kellie was recommended to have braces to see if that would help because she had [name].  She developed Kellie jaw [name] Kellie has had conflicting opinion from various orthodontists as to whether surgical correction would help or not. She takes Ibuprofen 800mg  BID, ultram, Kellie  Norco for [name].  In 2013, she saw ENT because of right facial [name] Kellie was told that she had a deviated septum so had surgery but has developed constant throbbing [name] of her nose Kellie upper teeth.  She has also been evaluated by Rheumatology for these symptoms who mentioned that her sensory complaints may be due to ulnar neuropathy.   Past Medical History  Diagnosis Date  . Atypical moles   . Umbilical hernia   . Kellie headaches   . Shingles 08/2011    Right chest Kellie arm  . Endometriosis of pelvis     Past Surgical History  Procedure Laterality Date  . Tonsillectomy    . Laparoscopic endometriosis fulguration    . Cesarean section      x2     Medications:  Current Outpatient Prescriptions on File Prior to Visit  Medication Sig Dispense Refill  . HYDROcodone-acetaminophen (NORCO) 7.5-325 MG per tablet One tablet every 4 hours when necessary for severe [name]  180 tablet  0  . ibuprofen (ADVIL,MOTRIN) 200 MG tablet Take 200 mg by mouth every 6 (six) hours as needed.        . ranitidine (ZANTAC) 75 MG tablet Take 75 mg by mouth as needed.        . traMADol (ULTRAM) 50 MG tablet one tablet twice a day when necessary for [name]  60 tablet  5   No current facility-administered medications on file prior to visit.    Allergies:  Allergies  Allergen Reactions  .  Latex Rash    Family History: Family History  Problem Relation Age of Onset  . Healthy Mother     Living, 26  . Kidney cancer Father     Living, 75  . Healthy Brother   . Fibromyalgia Maternal Grandmother     Social History: History   Social History  . Marital Status: Married    Spouse Name: N/A    Number of Children: N/A  . Years of Education: N/A   Occupational History  . Not on file.   Social History Main Topics  . Smoking status: Never Smoker   . Smokeless tobacco: Not on file  . Alcohol Use: No  . Drug Use: No  . Sexual Activity: Not on file   Other Topics Concern  . Not on file   Social  History Narrative   Lives with husband Kellie two daughter (ages 1, 5).  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    Review of Systems:  CONSTITUTIONAL: No fevers, chills, night sweats, or weight loss.   EYES: No visual changes or eye [name] ENT: No hearing changes.  No history of nose bleeds.   RESPIRATORY: No cough, wheezing Kellie shortness of breath.   CARDIOVASCULAR: Negative for chest [name], Kellie palpitations.   GI: Negative for abdominal discomfort, blood in stools or black stools.  No recent change in bowel habits.   GU:  No history of incontinence.   MUSCLOSKELETAL: +joint [name] or swelling.  No myalgias.   SKIN: Negative for lesions, rash, Kellie itching.   HEMATOLOGY/ONCOLOGY: Negative for prolonged bleeding, bruising easily, Kellie swollen nodes.    ENDOCRINE: Negative for cold or heat intolerance, polydipsia or goiter.   PSYCH:  No depression +anxiety symptoms.   NEURO: As Above.   Vital Signs:  BP 140/88  Pulse 80  Temp(Src) 98.4 F (36.9 C)  Resp 16  Ht 5\' 6"  (1.676 m)  Wt 136 lb 11.2 oz (62.007 kg)  BMI 22.07 kg/m2  Neurological Exam: MENTAL STATUS including orientation to time, place, person, recent Kellie remote memory, attention span Kellie concentration, language, Kellie fund of knowledge is normal.  Speech is not dysarthric.  CRANIAL NERVES: II:  No visual field defects.  Unremarkable fundi.   III-IV-VI: Pupils equal round Kellie reactive to light.  Normal conjugate, extra-ocular eye movements in all directions of gaze.  No nystagmus.  No ptosis prior to or post sustained upgaze.   V:  Normal facial sensation.    VII:  Mild left deviation of the jaw, otherwise normal facial symmetry Kellie movements.   VIII:  Normal hearing Kellie vestibular function.   IX-X:  Normal palatal movement.   XI:  Normal shoulder shrug Kellie head rotation.   XII:  Normal tongue strength Kellie range of motion, no deviation or fasciculation.  MOTOR:  No atrophy, fasciculations or  abnormal movements.  No pronator drift.  Negative Lhermitt's sign.  Right ganlgion cyst over medial distal forearm, manipulation Kellie palpation does not reproduce [name] in hand.  Right Upper Extremity:    Left Upper Extremity:    Deltoid  5/5   Deltoid  5/5   Biceps  5/5   Biceps  5/5   Triceps  5/5   Triceps  5/5   Wrist extensors  5/5   Wrist extensors  5/5   Wrist flexors  5/5   Wrist flexors  5/5   Finger extensors  5/5   Finger extensors  5/5  Finger flexors  5/5   Finger flexors  5/5   Dorsal interossei  5-/5   Dorsal interossei  5-/5   Abductor pollicis  5/5   Abductor pollicis  5/5   Tone (Ashworth scale)  0  Tone (Ashworth scale)  0   Right Lower Extremity:    Left Lower Extremity:    Hip flexors  5/5   Hip flexors  5/5   Hip extensors  5/5   Hip extensors  5/5   Knee flexors  5/5   Knee flexors  5/5   Knee extensors  5/5   Knee extensors  5/5   Dorsiflexors  5/5   Dorsiflexors  5/5   Plantarflexors  5/5   Plantarflexors  5/5   Toe extensors  5/5   Toe extensors  5/5   Toe flexors  5/5   Toe flexors  5/5   Tone (Ashworth scale)  0  Tone (Ashworth scale)  0   MSRs:  Right                                                                 Left brachioradialis 2+  brachioradialis 2+  biceps 2+  biceps 2+  triceps 2+  triceps 2+  patellar 2+  patellar 2+  ankle jerk 2+  ankle jerk 2+  Hoffman no  Hoffman no  plantar response down  plantar response down   SENSORY:  Normal Kellie symmetric perception of light touch, pinprick, vibration, Kellie proprioception.  Romberg's sign absent.  No tinel's over the elbow bilaterally.  COORDINATION/GAIT: Normal finger-to- nose-finger Kellie heel-to-shin.  Intact rapid alternating movements bilaterally.  Able to rise from a chair without using arms.  Gait narrow based Kellie stable. Tandem Kellie stressed gait intact.    IMPRESSION: Ms. Blowe is a 32 year-old [name] presenting with multiple complaints including paresthesias of her hands, left sided neck  [name] with radiation into her left hand, Kellie Kellie facial [name].  Her neurological examination discloses subtle weakness of the intrinsic hand muscles bilaterally.  Sensation Kellie reflexes are normal.   Based on her history, it is possible that she has bilateral ulnar neuropathy.  I would like to obtain Kellie EMG of both upper extremities to further evaluation since she describes left sided radicular symptoms, but [name] is greater in the right hand.  I do that her ganglion cyst on the right forearm is compressing the ulnar nerve, since there is no reproduction of [name] when I palpate it.  Regarding her Kellie facial [name], it seems that symptoms are skeletal in origin Kellie unlikely to represent primary neurological issue.     PLAN/RECOMMENDATIONS:  1.  EMG of bilateral upper extremities - ulnar nerve protocol 2.  Return to clinic in 4 weeks  The duration of this appointment visit was 60 minutes of face-to-face time with the patient.  Greater than 50% of this time was spent in counseling, explanation of diagnosis, planning of further management, Kellie coordination of care.   Thank you for allowing me to participate in patient's care.  If I can answer any additional questions, I would be pleased to do so.    Sincerely,    Nakai Pollio K. Allena Katz, DO

## 2013-07-01 ENCOUNTER — Ambulatory Visit (INDEPENDENT_AMBULATORY_CARE_PROVIDER_SITE_OTHER): Payer: 59 | Admitting: Family Medicine

## 2013-07-01 ENCOUNTER — Encounter: Payer: Self-pay | Admitting: Family Medicine

## 2013-07-01 VITALS — BP 130/108 | Temp 98.2°F | Wt 134.0 lb

## 2013-07-01 DIAGNOSIS — R030 Elevated blood-pressure reading, without diagnosis of hypertension: Secondary | ICD-10-CM

## 2013-07-01 DIAGNOSIS — J45901 Unspecified asthma with (acute) exacerbation: Secondary | ICD-10-CM

## 2013-07-01 DIAGNOSIS — M26609 Unspecified temporomandibular joint disorder, unspecified side: Secondary | ICD-10-CM

## 2013-07-01 DIAGNOSIS — N809 Endometriosis, unspecified: Secondary | ICD-10-CM

## 2013-07-01 DIAGNOSIS — J069 Acute upper respiratory infection, unspecified: Secondary | ICD-10-CM

## 2013-07-01 MED ORDER — PREDNISONE 20 MG PO TABS: ORAL_TABLET | ORAL | Status: DC

## 2013-07-01 MED ORDER — HYDROCODONE-ACETAMINOPHEN 7.5-325 MG PO TABS: ORAL_TABLET | ORAL | Status: DC

## 2013-07-01 NOTE — Progress Notes (Signed)
  Subjective:    Patient ID: [name], female    DOB: [birth year], 32 y.o.   MRN: [id number]  HPI [name] is a 32 year old married female who comes in today for evaluation of 2 problems  For the past week she's had a cough head congestion sore throat and feels tightness in the chest. She's a nonsmoker no history of asthma. Her children have been sick with colds a couple weeks ago. She is also bringing up some yellow to brown discolored sputum with no fever  She recently went to see the neurologist and is in the process of getting a workup. Her tentative diagnosis is ulnar neuropathy  She also has pain in the wrist and thinks the cyst in her wrist is getting bigger. We'll refer her back to Dr. Dora Sims for the hand surgeon  She also needs a refill of her pain medication   Review of Systems    review of systems otherwise negative Objective:   Physical Exam  Well-developed well-nourished female no acute distress vital signs stable she is afebrile ENT negative neck was supple no adenopathy lungs are clear except for some late symmetrical expiratory wheezing on forced expiration      Assessment & Plan:  Viral syndrome with secondary asthma prednisone  Ulnar neuropathy followup by Dr. Allena Katz  Pain in her wrist consult with Dr. Horatio Pel  TMJ pain refill pain medication,,,,,,,, impending consult at [facility] for a second opinion because of the ongoing pain

## 2013-07-01 NOTE — Patient Instructions (Signed)
Continue your current medications  Prednisone as directed for the cough and wheezing  The hand surgeon is Dr. Molly Maduro sypher

## 2013-07-07 ENCOUNTER — Encounter: Payer: 59 | Admitting: Neurology

## 2013-07-22 ENCOUNTER — Ambulatory Visit: Payer: 59 | Admitting: Neurology

## 2013-07-30 ENCOUNTER — Encounter: Payer: Self-pay | Admitting: *Deleted

## 2013-07-31 ENCOUNTER — Encounter: Payer: Self-pay | Admitting: Family Medicine

## 2013-07-31 ENCOUNTER — Ambulatory Visit (INDEPENDENT_AMBULATORY_CARE_PROVIDER_SITE_OTHER): Payer: 59 | Admitting: Family Medicine

## 2013-07-31 VITALS — BP 142/82 | Temp 98.0°F | Wt 134.0 lb

## 2013-07-31 DIAGNOSIS — M26609 Unspecified temporomandibular joint disorder, unspecified side: Secondary | ICD-10-CM

## 2013-07-31 DIAGNOSIS — N83201 Unspecified ovarian cyst, right side: Secondary | ICD-10-CM

## 2013-07-31 DIAGNOSIS — N83209 Unspecified ovarian cyst, unspecified side: Secondary | ICD-10-CM

## 2013-07-31 MED ORDER — HYDROCODONE-ACETAMINOPHEN 7.5-325 MG PO TABS: ORAL_TABLET | ORAL | Status: DC

## 2013-07-31 NOTE — Progress Notes (Signed)
Pre visit review using our clinic review tool, if applicable. No additional management support is needed unless otherwise documented below in the visit note. 

## 2013-07-31 NOTE — Patient Instructions (Signed)
Continue your current medications  Call when you are a week from being out of her pain medication for refills  Till Dr. Marton Redwood the your blood pressures indeed normal

## 2013-07-31 NOTE — Progress Notes (Signed)
   Subjective:    Patient ID: Conley Rolls, female    DOB: 13-May-1981, 32 y.o.   MRN: [id number]  HPI [name] is a 32 year old married female nonsmoker who comes in today for followup of 2 problems  We saw her a while back with elevated blood pressure. She's been monitoring her blood pressure at home and they've all been normal. Dr. Rana Snare her GYN changed her birth control pills however that was based on only one elevated reading.  She also and needs a refill her pain medication. As noted previously she's on Vicodin 6 tablets daily for chronic jaw pain. She is due to go back to see the oral surgeon ASAP   Review of Systems    negative Objective:   Physical Exam  Well-developed well-nourished female no acute distress vital signs stable she's afebrile BP right arm sitting position 142/82 BP at home 120/80      Assessment & Plan:  Normal blood pressure reassured  Chronic pain refill medication check urine

## 2013-08-26 ENCOUNTER — Other Ambulatory Visit: Payer: Self-pay | Admitting: *Deleted

## 2013-08-26 DIAGNOSIS — M26609 Unspecified temporomandibular joint disorder, unspecified side: Secondary | ICD-10-CM

## 2013-08-26 MED ORDER — HYDROCODONE-ACETAMINOPHEN 7.5-325 MG PO TABS: ORAL_TABLET | ORAL | Status: DC

## 2013-08-26 NOTE — Telephone Encounter (Signed)
Okay to refill per Dr Tawanna Cooler

## 2013-09-22 ENCOUNTER — Encounter: Payer: Self-pay | Admitting: Family Medicine

## 2013-09-22 ENCOUNTER — Ambulatory Visit (INDEPENDENT_AMBULATORY_CARE_PROVIDER_SITE_OTHER): Payer: 59 | Admitting: Family Medicine

## 2013-09-22 VITALS — BP 130/90 | Temp 99.2°F | Wt 134.0 lb

## 2013-09-22 DIAGNOSIS — M26609 Unspecified temporomandibular joint disorder, unspecified side: Secondary | ICD-10-CM

## 2013-09-22 MED ORDER — HYDROCODONE-ACETAMINOPHEN 7.5-325 MG PO TABS: ORAL_TABLET | ORAL | Status: DC

## 2013-09-22 NOTE — Patient Instructions (Signed)
I will refill your pain medication until we get you an appointment to see Dr. Nicholaus Bloom at the pain clinic

## 2013-09-22 NOTE — Progress Notes (Signed)
   Subjective:    Patient ID: Hoover Browns, female    DOB: May 29, 1981, 33 y.o.   MRN: [id number]  HPI Albina Billet is a 33 year old married female nonsmoker who comes in today for evaluation of chronic jaw pain  She is now seeing a new oral surgeon Dr. Earvin Hansen. She's now taking 6 Vicodin daily. I explained to her that I am not comfortable treating long-term pain. She recently got a drug test it was $895!!!!!!!!!!!!. I explained to her that I would go ahead and refill her medication until we can get her an appointment to see Dr. Nicholaus Bloom for further evaluation   Review of Systems Negative    Objective:   Physical Exam  Well-developed well-nourished female in acute stress vital signs stable she is afebrile      Assessment & Plan:  Chronic jaw pain plan refill medication temporarily....Marland KitchenMarland Kitchen

## 2013-10-14 ENCOUNTER — Ambulatory Visit: Payer: 59 | Admitting: Family Medicine

## 2013-10-15 ENCOUNTER — Encounter: Payer: Self-pay | Admitting: Family Medicine

## 2013-10-15 ENCOUNTER — Ambulatory Visit (INDEPENDENT_AMBULATORY_CARE_PROVIDER_SITE_OTHER): Payer: 59 | Admitting: Family Medicine

## 2013-10-15 VITALS — BP 120/80 | Temp 98.9°F | Wt 133.0 lb

## 2013-10-15 DIAGNOSIS — M26609 Unspecified temporomandibular joint disorder, unspecified side: Secondary | ICD-10-CM

## 2013-10-15 MED ORDER — HYDROCODONE-ACETAMINOPHEN 7.5-325 MG PO TABS: ORAL_TABLET | ORAL | Status: DC

## 2013-10-15 NOTE — Progress Notes (Signed)
   Subjective:    Patient ID: [name], female    DOB: [birth year], 33 y.o.   MRN: [id number]  HPI [name] is a 33 year old married female nonsmoker who comes in today for refill of her pain medication  She takes 5 of the Vicodin 7.5 mg daily because of your TMJ pain. We had tried to get her in to see Dr. Hardin Negus however they will have an open appointment until May.   Review of Systems Review of systems negative    Objective:   Physical Exam  Well-developed well-nourished female no acute distress vital signs stable she is afebrile      Assessment & Plan:  Chronic pain........Marland Kitchen refill medication appointment Dr. Hardin Negus in May.

## 2013-10-15 NOTE — Progress Notes (Signed)
Pre visit review using our clinic review tool, if applicable. No additional management support is needed unless otherwise documented below in the visit note. 

## 2013-10-15 NOTE — Patient Instructions (Signed)
Return in one month for followup sooner if any problems

## 2013-11-11 ENCOUNTER — Ambulatory Visit (INDEPENDENT_AMBULATORY_CARE_PROVIDER_SITE_OTHER): Payer: 59 | Admitting: Family Medicine

## 2013-11-11 ENCOUNTER — Encounter: Payer: Self-pay | Admitting: Family Medicine

## 2013-11-11 VITALS — BP 110/70 | Temp 99.2°F | Wt 130.0 lb

## 2013-11-11 DIAGNOSIS — M26609 Unspecified temporomandibular joint disorder, unspecified side: Secondary | ICD-10-CM

## 2013-11-11 MED ORDER — HYDROCODONE-ACETAMINOPHEN 7.5-325 MG PO TABS: ORAL_TABLET | ORAL | Status: DC

## 2013-11-11 NOTE — Patient Instructions (Signed)
Return when necessary 

## 2013-11-11 NOTE — Progress Notes (Signed)
   Subjective:    Patient ID: [name], female    DOB: December 02, 1980, 33 y.o.   MRN: [id number]  HPI [name] is a 33 year old married female nonsmoker who comes in today to refill her pain medication  She takes 4-6 pain pills per day because of chronic jaw pain. See the details of her current problem in previous notes. We are awaiting a appointment at the pain clinic but so far nobody's called her.   Review of Systems Review of systems negative    Objective:   Physical Exam  Well-developed well-nourished female no acute distress vital signs stable she is afebrile      Assessment & Plan:  Chronic jaw pain refill pain medication hopefully get appointment at the pain clinic ASAP

## 2013-11-11 NOTE — Progress Notes (Signed)
Pre visit review using our clinic review tool, if applicable. No additional management support is needed unless otherwise documented below in the visit note. 

## 2013-12-09 ENCOUNTER — Ambulatory Visit (INDEPENDENT_AMBULATORY_CARE_PROVIDER_SITE_OTHER): Payer: 59 | Admitting: Family Medicine

## 2013-12-09 ENCOUNTER — Encounter: Payer: Self-pay | Admitting: Family Medicine

## 2013-12-09 VITALS — BP 110/70 | Temp 98.6°F | Wt 132.0 lb

## 2013-12-09 DIAGNOSIS — M26609 Unspecified temporomandibular joint disorder, unspecified side: Secondary | ICD-10-CM

## 2013-12-09 MED ORDER — HYDROCODONE-ACETAMINOPHEN 7.5-325 MG PO TABS: ORAL_TABLET | ORAL | Status: DC

## 2013-12-09 NOTE — Progress Notes (Signed)
   Subjective:    Patient ID: [name], female    DOB: 05/13/1981, 33 y.o.   MRN: [id number]  HPI [name] is a 33 year old married female nonsmoker who comes in today for evaluation of chronic pain  She has chronic TMJ pain and takes Vicodin every 4 hours. If she doesn't take it every 4 hours she has severe pain. She's been on the waiting list for the pain clinic here in [place] for couple months and is frustrated because they haven't called her for an appointment. She's found the pain clinic and [place] closer to her home and wants to know she can be referred there.   Review of Systems Review of systems negative    Objective:   Physical Exam Well-developed well-nourished female in acute distress vital signs stable she is afebrile       Assessment & Plan:  Chronic pain,,,,,, referred to the [facility] [place] to see if she can get in sooner,

## 2013-12-09 NOTE — Progress Notes (Signed)
Pre visit review using our clinic review tool, if applicable. No additional management support is needed unless otherwise documented below in the visit note. 

## 2013-12-29 ENCOUNTER — Encounter: Payer: Self-pay | Admitting: Family Medicine

## 2013-12-29 ENCOUNTER — Ambulatory Visit (INDEPENDENT_AMBULATORY_CARE_PROVIDER_SITE_OTHER): Payer: 59 | Admitting: Family Medicine

## 2013-12-29 VITALS — BP 140/90 | Temp 99.1°F | Wt 134.0 lb

## 2013-12-29 DIAGNOSIS — M26609 Unspecified temporomandibular joint disorder, unspecified side: Secondary | ICD-10-CM

## 2013-12-29 MED ORDER — HYDROCODONE-ACETAMINOPHEN 7.5-325 MG PO TABS: ORAL_TABLET | ORAL | Status: DC

## 2013-12-29 NOTE — Progress Notes (Signed)
Pre visit review using our clinic review tool, if applicable. No additional management support is needed unless otherwise documented below in the visit note. 

## 2013-12-29 NOTE — Patient Instructions (Signed)
Continue your current medication  Followup in one month when necessary

## 2013-12-29 NOTE — Progress Notes (Signed)
   Subjective:    Patient ID: [name], female    DOB: September 06, 1980, 33 y.o.   MRN: [id number]  HPI [name] is a delightful 33 year old married female nonsmoker who comes in today for followup of chronic jaw pain  She continues to work with her dentist and her oral Psychologist, sport and exercise. Still waiting for a consult at the pain clinic.   Review of Systems Review of systems negative    Objective:   Physical Exam  Well-developed well-nourished female in acute distress vital signs stable she is afebrile      Assessment & Plan:  Chronic jaw pain continue current medication,,,,,,,,,,,, consult with pain clinic ASAP request made in January

## 2014-01-06 ENCOUNTER — Telehealth: Payer: Self-pay | Admitting: Family Medicine

## 2014-01-06 DIAGNOSIS — M26609 Unspecified temporomandibular joint disorder, unspecified side: Secondary | ICD-10-CM

## 2014-01-06 NOTE — Telephone Encounter (Signed)
Pt wanted to let you know the pain management referral needs to go to [facility] in [facility] [phone].  She states the referral should state, "TMJ dislocated surgery will not work".

## 2014-01-22 ENCOUNTER — Ambulatory Visit: Payer: 59 | Admitting: Family Medicine

## 2014-02-02 ENCOUNTER — Ambulatory Visit: Payer: 59 | Admitting: Family Medicine

## 2014-04-27 ENCOUNTER — Encounter: Payer: Self-pay | Admitting: Family Medicine

## 2014-04-27 ENCOUNTER — Ambulatory Visit (INDEPENDENT_AMBULATORY_CARE_PROVIDER_SITE_OTHER): Payer: 59 | Admitting: Family Medicine

## 2014-04-27 VITALS — BP 110/80 | Temp 98.9°F | Wt 128.0 lb

## 2014-04-27 DIAGNOSIS — R1013 Epigastric pain: Secondary | ICD-10-CM

## 2014-04-27 DIAGNOSIS — Z23 Encounter for immunization: Secondary | ICD-10-CM

## 2014-04-27 DIAGNOSIS — R52 Pain, unspecified: Secondary | ICD-10-CM

## 2014-04-27 LAB — CBC
HEMATOCRIT: 43.6 % (ref 36.0–46.0)
HEMOGLOBIN: 14.3 g/dL (ref 12.0–15.0)
MCHC: 32.8 g/dL (ref 30.0–36.0)
MCV: 92 fl (ref 78.0–100.0)
Platelets: 220 10*3/uL (ref 150.0–400.0)
RBC: 4.74 Mil/uL (ref 3.87–5.11)
RDW: 13.1 % (ref 11.5–15.5)
WBC: 8.4 10*3/uL (ref 4.0–10.5)

## 2014-04-27 LAB — H. PYLORI ANTIBODY, IGG: H PYLORI IGG: NEGATIVE

## 2014-04-27 NOTE — Progress Notes (Signed)
Pre visit review using our clinic review tool, if applicable. No additional management support is needed unless otherwise documented below in the visit note. 

## 2014-04-27 NOTE — Progress Notes (Signed)
   Subjective:    Patient ID: [name], female    DOB: 03-22-1981, 33 y.o.   MRN: [id number]  HPI Albina Billet is a 33 year old married female nonsmoker who comes in today for evaluation of epigastric abdominal pain for 6 weeks  She's had a 10 year plus history of GI upset treated with Zantac successfully until 6 weeks ago she had midepigastric burning. She went to an urgent care he was given a GI cocktail and told to followup with her primary care physician. At that time she was taking Motrin 800 twice a day. She stopped that.  She's had no fever chills nausea vomiting diarrhea change in bowel habits. No rectal bleeding etc. etc. Able to sleep at night  She says typically the burning starts about an hour she eats although she has no symptoms of gallbladder disease   Review of Systems Review of systems otherwise negative    Objective:   Physical Exam  Well-developed well-nourished female no acute distress vital signs stable she is afebrile examination the abdomen is negative      Assessment & Plan:  Midepigastric burning abdominal pain.............Marland Kitchen rule out ulcer disease,,,,,,,, labs today.

## 2014-04-27 NOTE — Patient Instructions (Signed)
Avoid caffeine and peppermint  Prilosec 20 mg,,,,,,,,,,,, one twice daily  Labs today  I will call you the report ASAP

## 2014-04-29 ENCOUNTER — Other Ambulatory Visit: Payer: Self-pay | Admitting: Family Medicine

## 2014-04-29 DIAGNOSIS — K219 Gastro-esophageal reflux disease without esophagitis: Secondary | ICD-10-CM

## 2014-04-29 DIAGNOSIS — K589 Irritable bowel syndrome without diarrhea: Secondary | ICD-10-CM

## 2014-06-02 ENCOUNTER — Encounter: Payer: Self-pay | Admitting: Family Medicine

## 2014-07-27 ENCOUNTER — Other Ambulatory Visit: Payer: Self-pay | Admitting: Obstetrics and Gynecology

## 2014-07-28 LAB — CYTOLOGY - PAP

## 2015-01-11 ENCOUNTER — Ambulatory Visit: Payer: Self-pay | Admitting: Family Medicine

## 2015-06-28 ENCOUNTER — Ambulatory Visit: Payer: Self-pay | Admitting: Family Medicine

## 2015-06-28 ENCOUNTER — Telehealth: Payer: Self-pay | Admitting: *Deleted

## 2015-06-28 DIAGNOSIS — IMO0002 Reserved for concepts with insufficient information to code with codable children: Secondary | ICD-10-CM

## 2015-06-28 MED ORDER — TRAMADOL HCL 50 MG PO TABS
50.0000 mg | ORAL_TABLET | Freq: Three times a day (TID) | ORAL | Status: DC | PRN
Start: 2015-06-28 — End: 2024-05-06

## 2015-06-28 NOTE — Telephone Encounter (Signed)
Patient is calling because she has a cyst on her hand.  Okay to refer per Dr Sherren Mocha. Also she request a refill of Tramadol.  Okay per Dr Sherren Mocha

## 2015-12-13 ENCOUNTER — Other Ambulatory Visit: Payer: Self-pay | Admitting: Family Medicine

## 2015-12-16 ENCOUNTER — Telehealth: Payer: Self-pay | Admitting: Family Medicine

## 2015-12-16 NOTE — Telephone Encounter (Signed)
This request has been denied.  Patient has not been seen since 2015.  Patient will need to schedule an appointment for further refills.  Notice sent to the pharmacy.

## 2015-12-16 NOTE — Telephone Encounter (Signed)
Pt need refill on Rx Tramdol  50 mg.  Pharm:  [facility] [place]  [phone]

## 2015-12-24 ENCOUNTER — Other Ambulatory Visit: Payer: Self-pay | Admitting: Oral Surgery

## 2015-12-24 DIAGNOSIS — R9389 Abnormal findings on diagnostic imaging of other specified body structures: Secondary | ICD-10-CM

## 2015-12-28 ENCOUNTER — Ambulatory Visit
Admission: RE | Admit: 2015-12-28 | Discharge: 2015-12-28 | Disposition: A | Discharge status: Home / Self Care | Payer: 59 | Source: Ambulatory Visit | Attending: Oral Surgery | Admitting: Oral Surgery

## 2015-12-28 DIAGNOSIS — R9389 Abnormal findings on diagnostic imaging of other specified body structures: Secondary | ICD-10-CM

## 2015-12-28 IMAGING — CT CT TEMPORAL BONES W/ CM
4 of 6 series · 17 of 30 positions shown, 19 images · IV contrast (75CC ISOVUE 300)
Comparison: None.

CLINICAL DATA: Posterior left irregular mass on x-ray. LUTA like
mass present for 3 years.

EXAM:
CT TEMPORAL BONES WITH CONTRAST
TECHNIQUE: Axial and coronal plane CT imaging of the petrous temporal bones was
performed with thin-collimation image reconstruction after
intravenous contrast administration. Multiplanar CT image
reconstructions were also generated.
CONTRAST:  75mL [EQ] IOPAMIDOL ([EQ]) INJECTION 61%

[Series 3: ax mag right · axial · 0.20mm/px · z∈[-16,+31]mm · 5 of 227 slices shown, 7 images]
[im 38/227  brain]
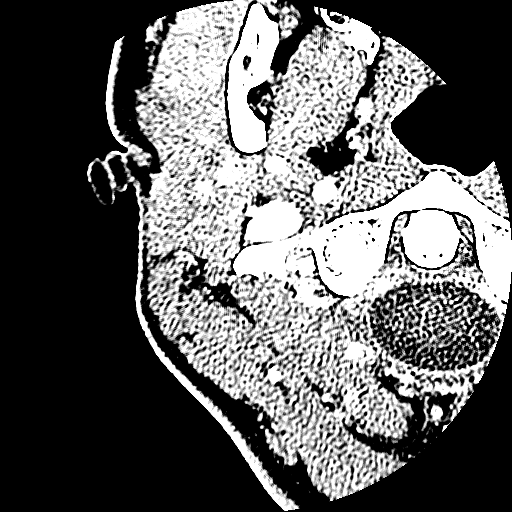
[im 38/227  bone]
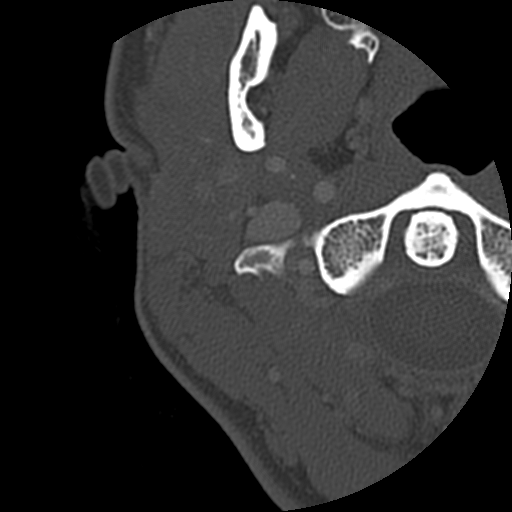
[im 76/227  bone]
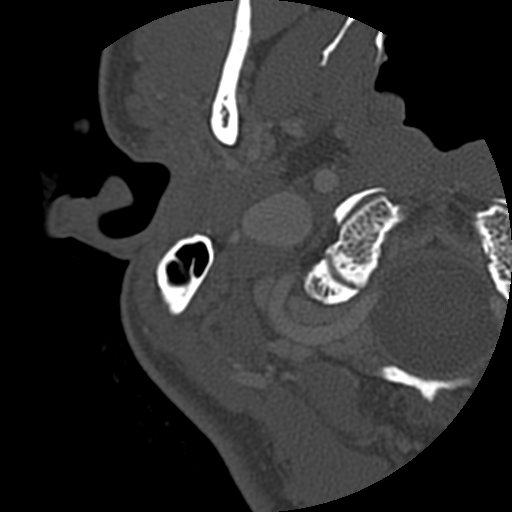
[im 114/227  bone]
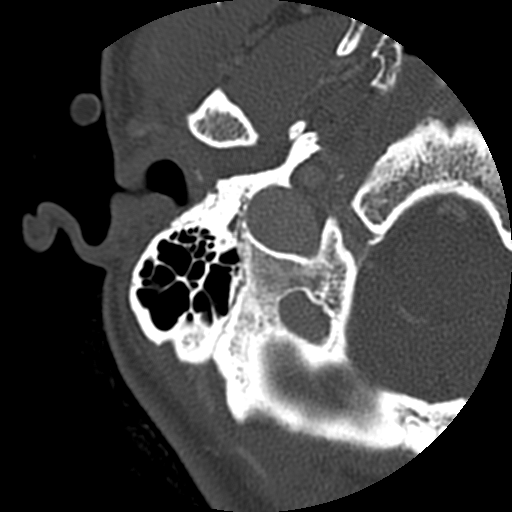
[im 151/227  bone]
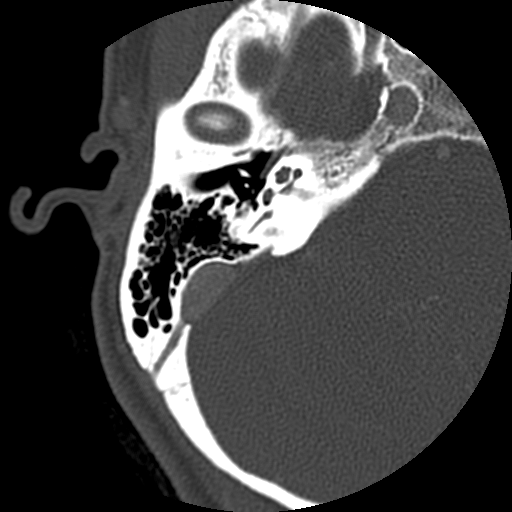
[im 189/227  brain]
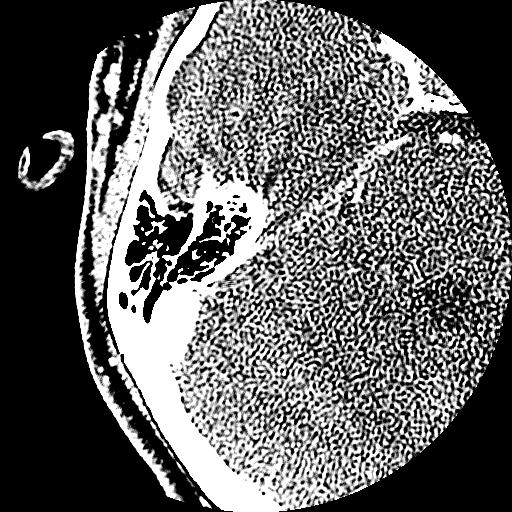
[im 189/227  bone]
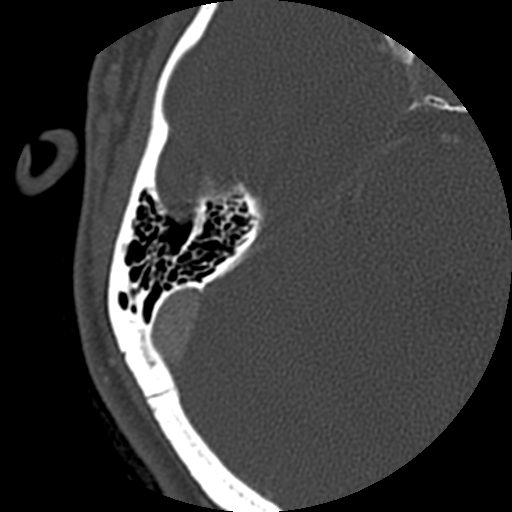

[Series 4: ax mag left · axial · 0.20mm/px · z∈[-13,+29]mm · 4 of 227 slices shown]
[im 46/227  bone]
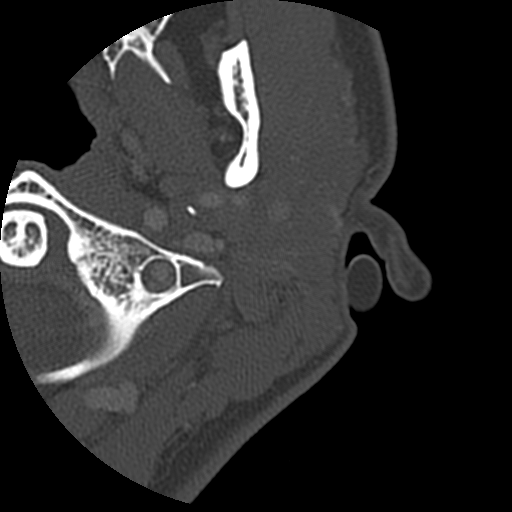
[im 91/227  bone]
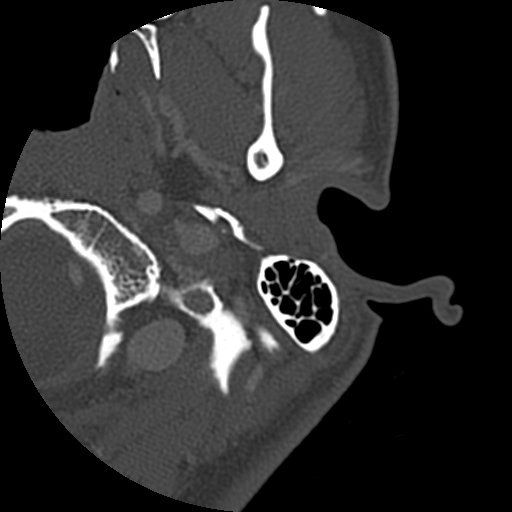
[im 136/227  bone]
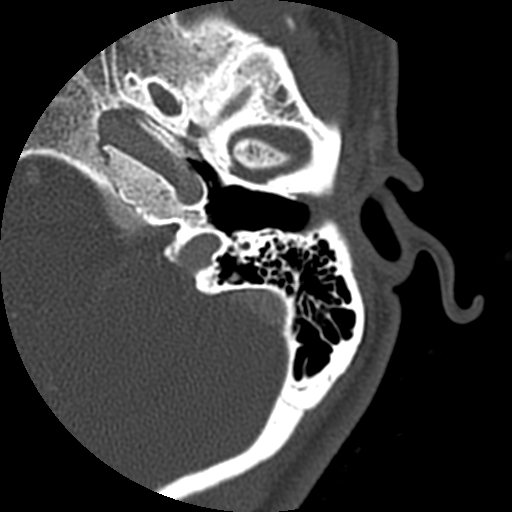
[im 181/227  bone]
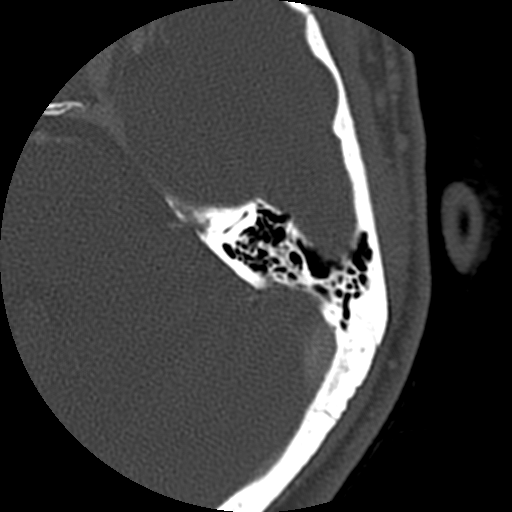

[Series 300: cor mag right · coronal · 0.20mm/px · 3 of 224 slices shown]
[im 45/224  bone]
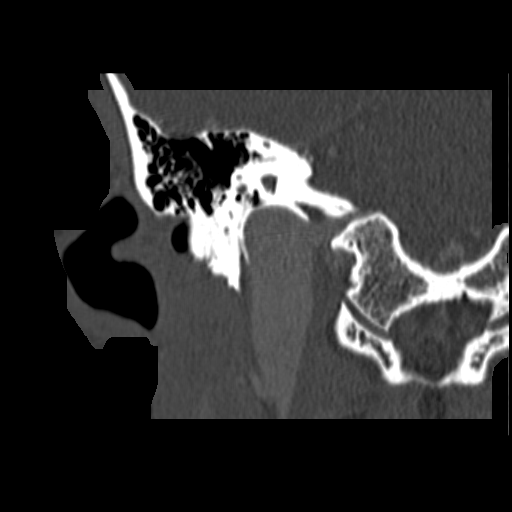
[im 90/224  bone]
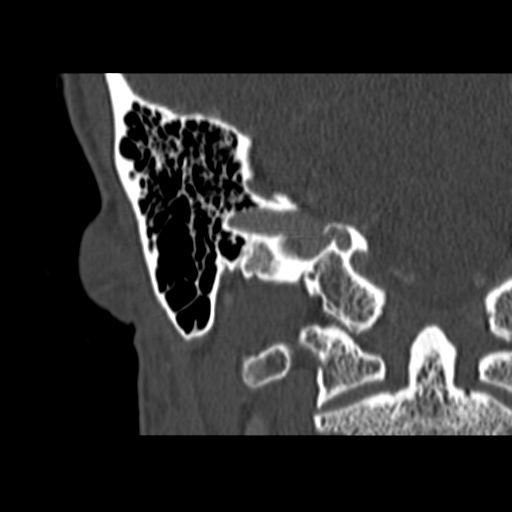
[im 134/224  bone]
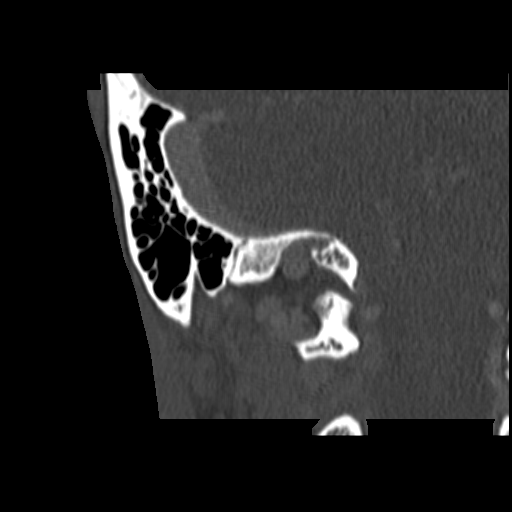

[Series 400: cor mag left · coronal · 0.20mm/px · 5 of 248 slices shown]
[im 42/248  bone]
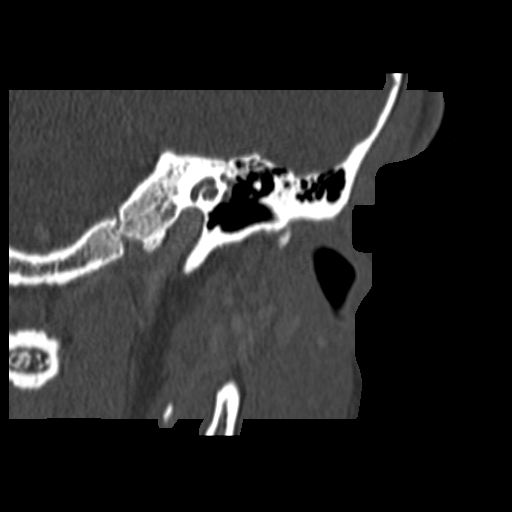
[im 83/248  bone]
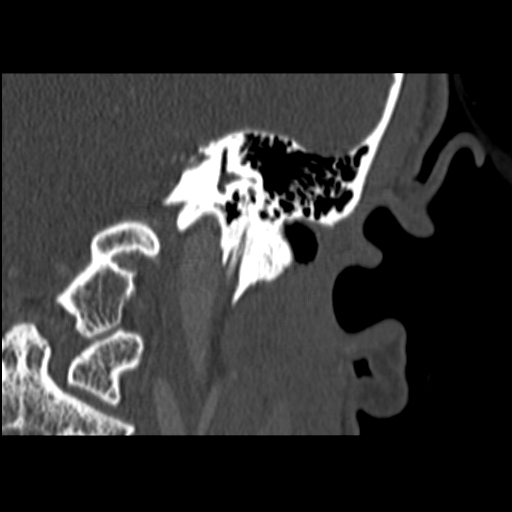
[im 124/248  bone]
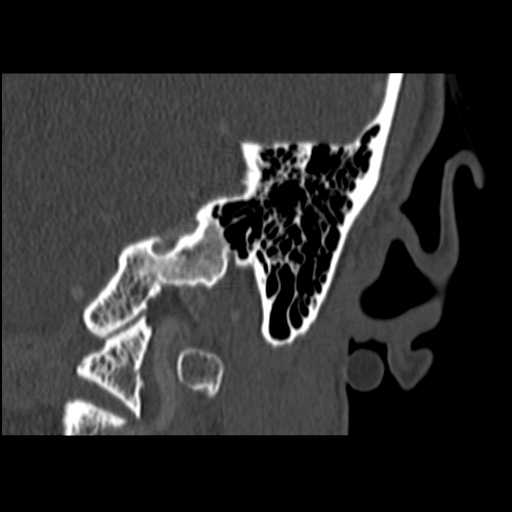
[im 165/248  bone]
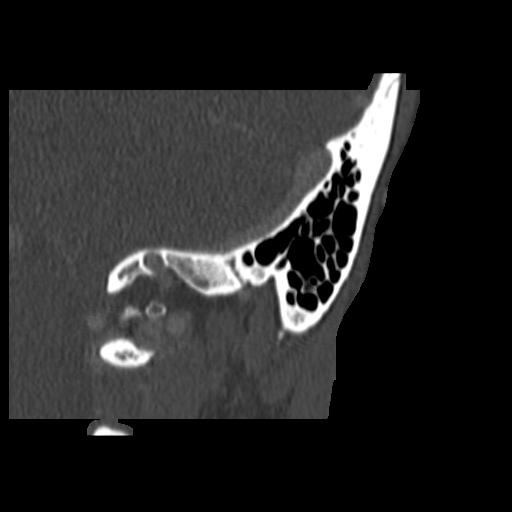
[im 206/248  bone]
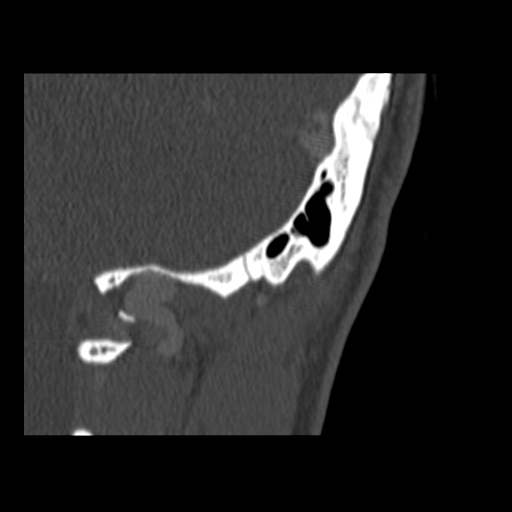

[17 of 30 positions shown; findings below may reference images not displayed]

FINDINGS: RIGHT: The external auditory canal and tympanic membrane are
unremarkable. The ossicles appear intact. The tympanic cavity is
clear. The internal auditory canal, cochlea, vestibule, and
semicircular canals are unremarkable. The mastoid air cells are
clear.

LEFT: The external auditory canal and tympanic membrane are
unremarkable. The ossicles appear intact. The tympanic cavity is
clear. The internal auditory canal, cochlea, vestibule, and
semicircular canals are unremarkable. The mastoid air cells are
clear.

A vitamin E marker has been placed posterior to the inferior aspect
of the left auricle to indicate the area of clinical concern. At the
superior aspect of the marker just lateral to the mastoid tip in the
subcutaneous soft tissues is a 5 x 3 mm ovoid soft tissue nodule
(series 5, image 8). No other regional soft tissue abnormality or
osseous lesion is identified.

The visualized paranasal sinuses are clear. The visualized portion
of the brain is unremarkable. Visualized orbits are unremarkable.
IMPRESSION: 5 x 3 mm nodule posterior to the left ear, likely a postauricular
lymph node. Otherwise unremarkable temporal bone CT.

## 2015-12-28 MED ORDER — IOPAMIDOL (ISOVUE-300) INJECTION 61%
75.0000 mL | Freq: Once | INTRAVENOUS | Status: AC | PRN
  Administered 2015-12-28: 75 mL via INTRAVENOUS

## 2017-04-23 ENCOUNTER — Other Ambulatory Visit: Payer: Self-pay | Admitting: Obstetrics and Gynecology

## 2018-10-09 ENCOUNTER — Other Ambulatory Visit (HOSPITAL_COMMUNITY): Payer: Self-pay | Admitting: Endocrinology

## 2018-10-09 ENCOUNTER — Other Ambulatory Visit: Payer: Self-pay | Admitting: Endocrinology

## 2018-10-09 DIAGNOSIS — E0590 Thyrotoxicosis, unspecified without thyrotoxic crisis or storm: Secondary | ICD-10-CM

## 2018-10-16 ENCOUNTER — Encounter (HOSPITAL_COMMUNITY)
Admission: RE | Admit: 2018-10-16 | Discharge: 2018-10-16 | Disposition: A | Discharge status: Home / Self Care | Payer: 59 | Source: Ambulatory Visit | Attending: Endocrinology | Admitting: Endocrinology

## 2018-10-16 ENCOUNTER — Encounter (HOSPITAL_COMMUNITY): Payer: Self-pay | Admitting: Radiology

## 2018-10-16 DIAGNOSIS — E0590 Thyrotoxicosis, unspecified without thyrotoxic crisis or storm: Secondary | ICD-10-CM | POA: Diagnosis not present

## 2018-10-16 IMAGING — NM NM THYROID IMAGING W/ UPTAKE MULTI (4&24 HR)
4 series · 4 of 4 positions shown · non-contrast
Comparison: None

CLINICAL DATA: Primary hyperthryoidism

EXAM:
THYROID SCAN AND UPTAKE - 4 AND 24 HOURS
TECHNIQUE: Following oral administration of [AU] capsule, anterior planar
imaging was acquired at 24 hours. Thyroid uptake was calculated with
a thyroid probe at 4-6 hours and 24 hours.
RADIOPHARMACEUTICALS:  488 uCi [AU] sodium iodide p.o.

[iu thyroid uptake i123 · 3.10mm/px · 1 of 1 slices shown (1 of 4)]
[im 1/1]
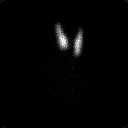

[iu thyroid uptake i123 · 3.10mm/px · 1 of 1 slices shown (2 of 4)]
[im 1/1]
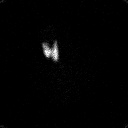

[iu thyroid uptake i123 · 3.10mm/px · 1 of 1 slices shown (3 of 4)]
[im 1/1]
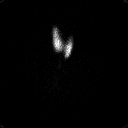

[iu thyroid uptake i123 · 3.10mm/px · 1 of 1 slices shown (4 of 4)]
[im 1/1]
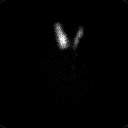

[4 of 4 positions shown; findings below may reference images not displayed]

FINDINGS: Homogeneous distribution of tracer throughout both thyroid lobes.

No focal areas of increased or decreased tracer localization.

4 hour [AU] uptake = 8.1% (normal 5-20%)

24 hour [AU] uptake = 12.6% (normal 10-30%)
IMPRESSION: Normal 4 hour and 24 hour radio iodine uptakes.

Normal thyroid scan.

## 2018-10-16 MED ORDER — SODIUM IODIDE I-123 7.4 MBQ CAPS
488.0000 | ORAL_CAPSULE | Freq: Once | ORAL | Status: AC
  Administered 2018-10-16: 488 via ORAL

## 2018-10-17 ENCOUNTER — Encounter (HOSPITAL_COMMUNITY)
Admission: RE | Admit: 2018-10-17 | Discharge: 2018-10-17 | Disposition: A | Discharge status: Home / Self Care | Payer: 59 | Source: Ambulatory Visit | Attending: Endocrinology | Admitting: Endocrinology

## 2021-04-04 ENCOUNTER — Other Ambulatory Visit: Payer: Self-pay | Admitting: Obstetrics and Gynecology

## 2021-04-04 DIAGNOSIS — N6310 Unspecified lump in the right breast, unspecified quadrant: Secondary | ICD-10-CM

## 2021-04-08 ENCOUNTER — Other Ambulatory Visit: Payer: 59

## 2021-05-23 ENCOUNTER — Other Ambulatory Visit: Payer: Self-pay | Admitting: Obstetrics and Gynecology

## 2021-05-23 DIAGNOSIS — Z803 Family history of malignant neoplasm of breast: Secondary | ICD-10-CM

## 2021-06-05 ENCOUNTER — Ambulatory Visit
Admission: RE | Admit: 2021-06-05 | Discharge: 2021-06-05 | Disposition: A | Discharge status: Home / Self Care | Payer: 59 | Source: Ambulatory Visit | Attending: Obstetrics and Gynecology | Admitting: Obstetrics and Gynecology

## 2021-06-05 ENCOUNTER — Other Ambulatory Visit: Payer: Self-pay

## 2021-06-05 DIAGNOSIS — Z803 Family history of malignant neoplasm of breast: Secondary | ICD-10-CM

## 2021-06-05 IMAGING — MR MR BREAST BILAT WO/W CM
8 of 12 series · 33 of 48 positions shown · IV contrast (6 GADAVIST)
Comparison: Prior mammograms.

CLINICAL DATA: Patient with elevated lifetime risk of breast
cancer.

EXAM:
BILATERAL BREAST MRI WITH AND WITHOUT CONTRAST
TECHNIQUE: Multiplanar, multisequence MR images of both breasts were obtained
prior to and following the intravenous administration of ml of
Gadavist

[Series 2: t2_tirm_tra ipat (a-p) · axial · 3.0mm · 0.70mm/px · 1 of 47 slices shown]
[im 1/47]
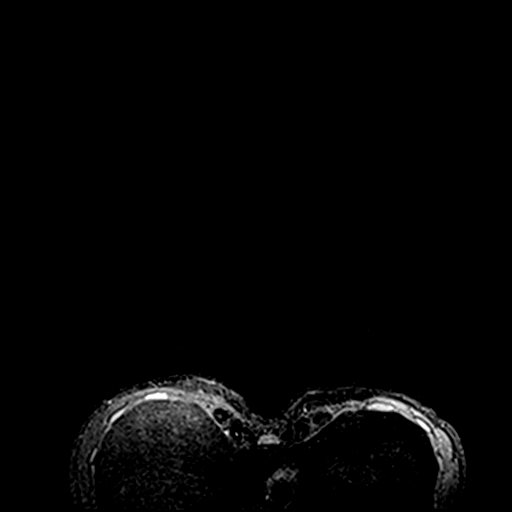

[Series 3: fl3d pre-cm no · axial · non-contrast · 1.2mm · 0.94mm/px · z∈[-64,+79]mm · 5 of 120 slices shown]
[im 1/120]
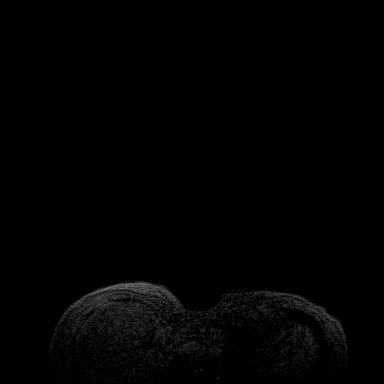
[im 30/120]
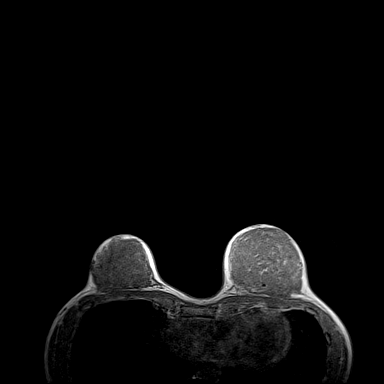
[im 60/120]
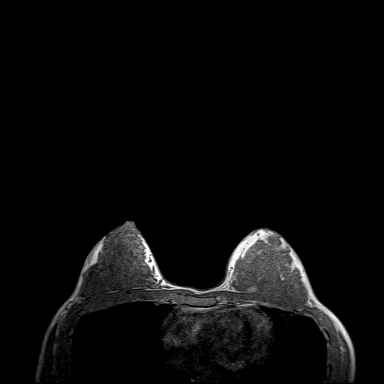
[im 90/120]
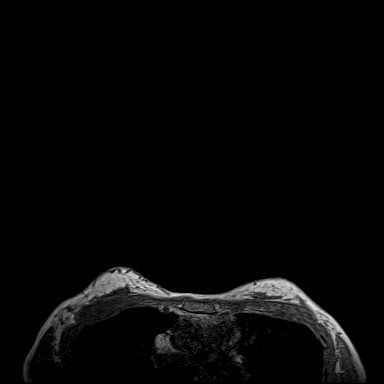
[im 120/120]
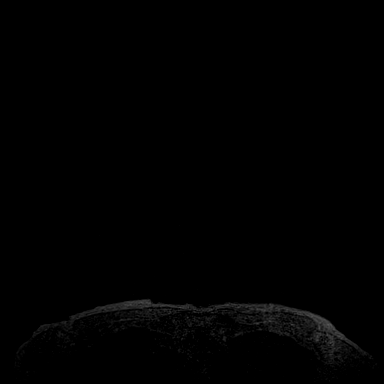

[Series 4: fl3d pre-cm · axial · non-contrast · 1.2mm · 0.94mm/px · z∈[-64,+79]mm · 5 of 120 slices shown]
[im 1/120]
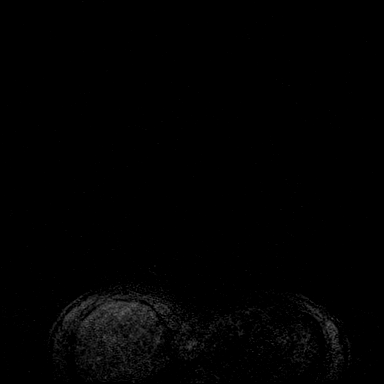
[im 30/120]
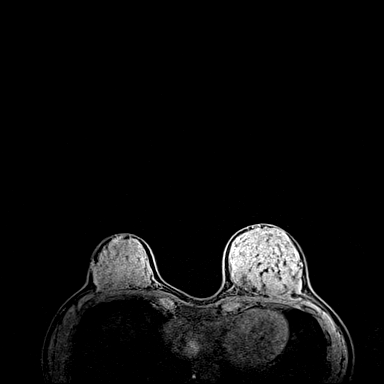
[im 60/120]
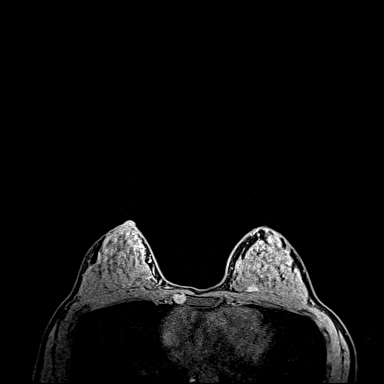
[im 90/120]
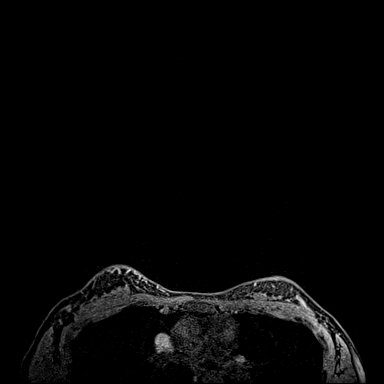
[im 120/120]
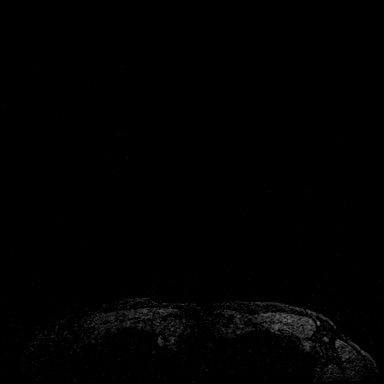

[Series 5: fl3d post immediate · axial · 1.2mm · 0.94mm/px · z∈[-64,+79]mm · 5 of 120 slices shown (1 of 3)]
[im 1/120]
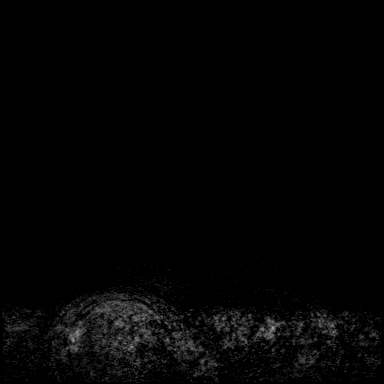
[im 30/120]
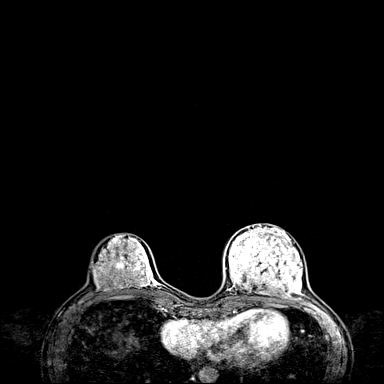
[im 60/120]
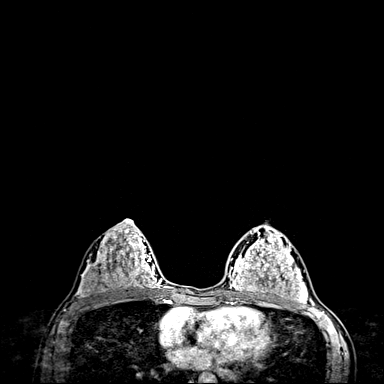
[im 90/120]
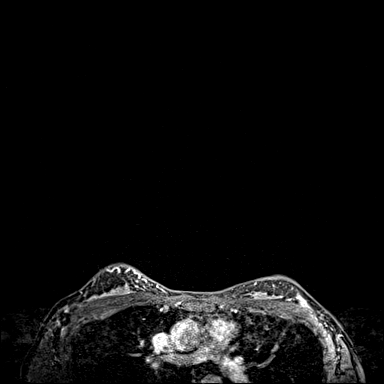
[im 120/120]
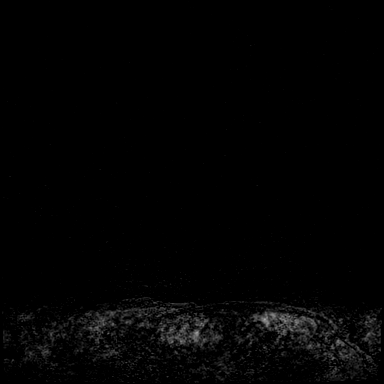

[Series 6: fl3d post immediate · axial · 1.2mm · 0.94mm/px · z∈[-64,+79]mm · 5 of 120 slices shown (2 of 3)]
[im 1/120]
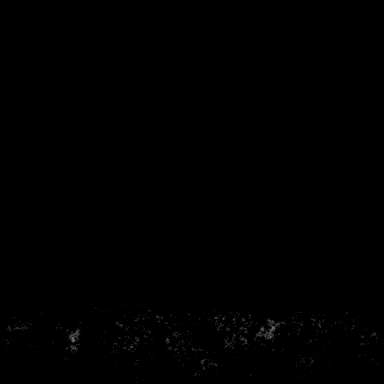
[im 30/120]
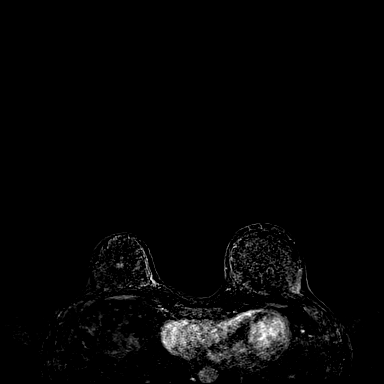
[im 60/120]
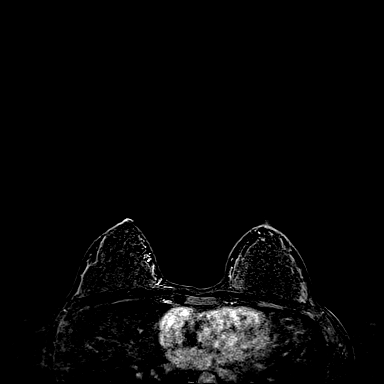
[im 90/120]
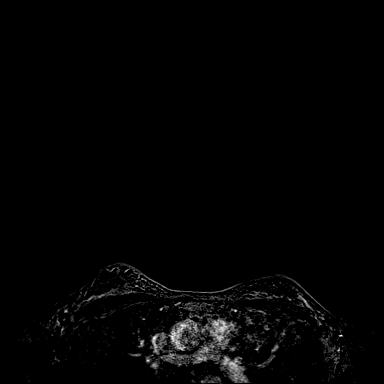
[im 120/120]
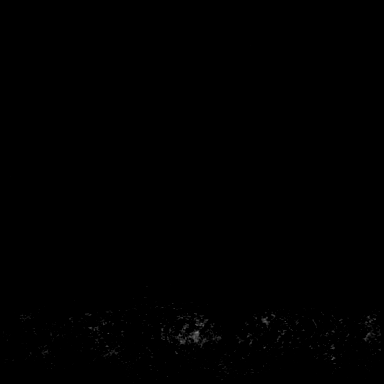

[Series 7: fl3d post immediate · axial · 144.0mm · 0.94mm/px · 1 of 1 slices shown (3 of 3)]
[im 1/1]
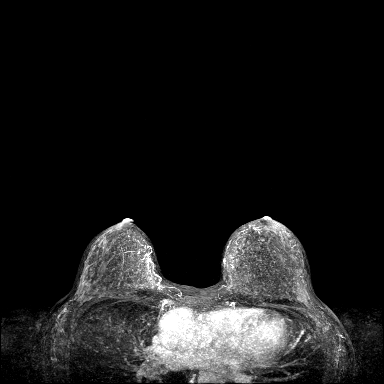

[Series 8: fl3d post 3min · axial · 1.2mm · 0.94mm/px · z∈[-64,+79]mm · 6 of 120 slices shown]
[im 1/120]
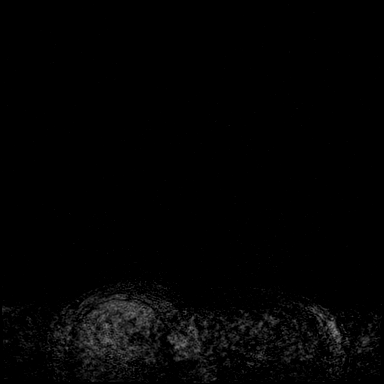
[im 24/120]
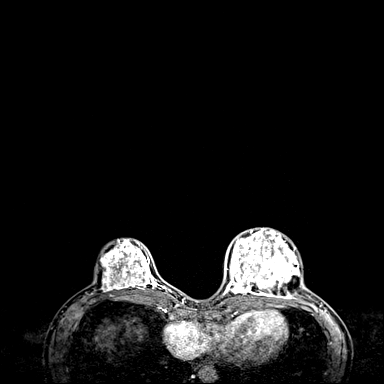
[im 48/120]
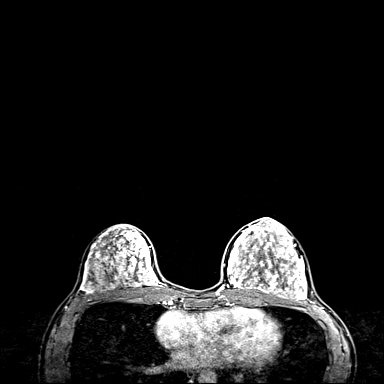
[im 72/120]
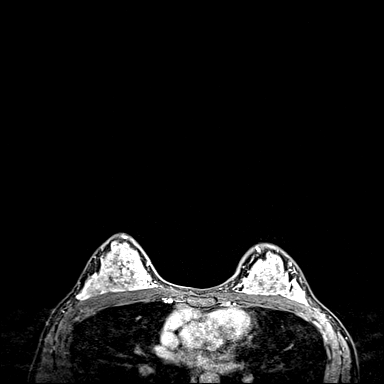
[im 96/120]
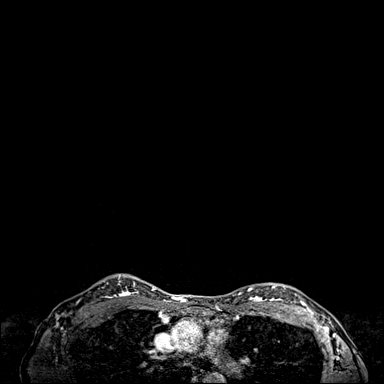
[im 120/120]
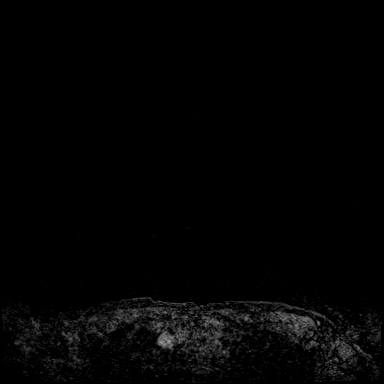

[Series 9: fl3d post 3min_sub · axial · 1.2mm · 0.94mm/px · z∈[-64,+50]mm · 5 of 120 slices shown]
[im 1/120]
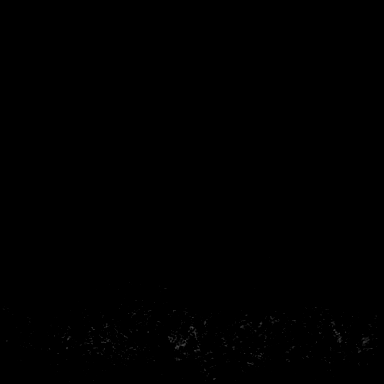
[im 24/120]
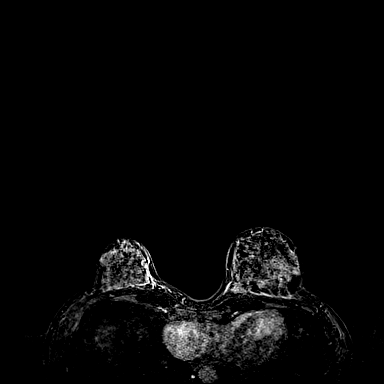
[im 48/120]
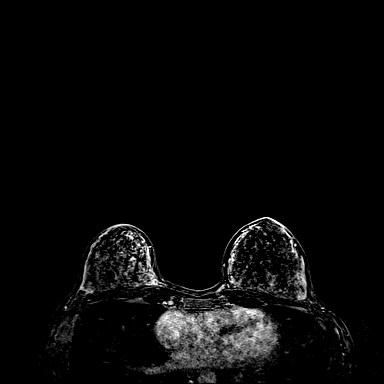
[im 72/120]
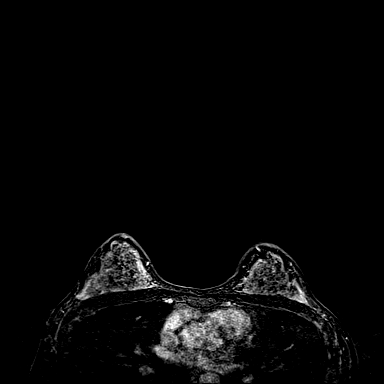
[im 96/120]
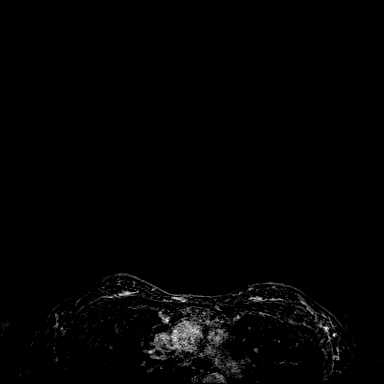

[33 of 48 positions shown; findings below may reference images not displayed]

Three-dimensional MR images were rendered by post-processing of the
original MR data on an independent workstation. The
three-dimensional MR images were interpreted, and findings are
reported in the following complete MRI report for this study. Three
dimensional images were evaluated at the independent interpreting
workstation using the DynaCAD thin client.
FINDINGS: Breast composition: d. Extreme fibroglandular tissue.

Background parenchymal enhancement: Mild

Right breast: Within the inferior right breast there is a 7 x 7 mm
irregular enhancing mass (image 91; series 120). No additional
concerning areas of enhancement identified within the right breast.

Left breast: No mass or abnormal enhancement.

Lymph nodes: No abnormal appearing lymph nodes.

Ancillary findings:  None.
IMPRESSION: Indeterminate enhancing mass within the inferior aspect of the right
breast.

No additional suspicious areas of enhancement identified within
either breast.

RECOMMENDATION:
MRI guided core needle biopsy enhancing mass inferior right breast.

BI-RADS CATEGORY  4: Suspicious.

## 2021-06-05 MED ORDER — GADOBUTROL 1 MMOL/ML IV SOLN
6.0000 mL | Freq: Once | INTRAVENOUS | Status: AC | PRN
  Administered 2021-06-05: 6 mL via INTRAVENOUS

## 2021-06-07 ENCOUNTER — Other Ambulatory Visit: Payer: Self-pay | Admitting: Obstetrics and Gynecology

## 2021-06-07 DIAGNOSIS — R9389 Abnormal findings on diagnostic imaging of other specified body structures: Secondary | ICD-10-CM

## 2021-06-15 ENCOUNTER — Ambulatory Visit
Admission: RE | Admit: 2021-06-15 | Discharge: 2021-06-15 | Disposition: A | Discharge status: Home / Self Care | Payer: 59 | Source: Ambulatory Visit | Attending: Obstetrics and Gynecology | Admitting: Obstetrics and Gynecology

## 2021-06-15 ENCOUNTER — Other Ambulatory Visit: Payer: Self-pay | Admitting: Obstetrics and Gynecology

## 2021-06-15 ENCOUNTER — Other Ambulatory Visit: Payer: Self-pay

## 2021-06-15 ENCOUNTER — Ambulatory Visit: Admission: RE | Admit: 2021-06-15 | Payer: 59 | Source: Ambulatory Visit

## 2021-06-15 DIAGNOSIS — R9389 Abnormal findings on diagnostic imaging of other specified body structures: Secondary | ICD-10-CM

## 2021-06-15 IMAGING — MR MR BREAST BX W/ LOC DEV 1ST LEASION IMAGE BX SPEC MR GUIDE*R*
6 of 8 series · 34 of 48 positions shown · IV contrast (5 ml gadavist)
Comparison: Bilateral breast MRI dated [DATE].

CLINICAL DATA: 7 x 7 mm irregular enhancing mass in the inferior
right breast on a recent screening MRI recommended for MR guided
core needle biopsy. The patient has an elevated lifetime risk of
developing breast cancer was her mother diagnosed with right breast
cancer at age 59 with subsequent bilateral mastectomies. The patient
does not have menstrual periods due to a previous hysterectomy. Her
ovaries are intact.

LABS:  None obtained on site today.
EXAM:
MR OF THE RIGHT BREAST WITH AND WITHOUT CONTRAST
TECHNIQUE: Multiplanar, multisequence MR images of the right breast were
obtained prior to and following the intravenous administration of 5
ml of Gadavist.

[Series 3: fiducial unilateral · sagittal · 2.0mm · 1.33mm/px · 1 of 52 slices shown]
[im 1/52]
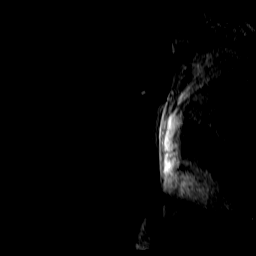

[Series 4: dynamic pre · axial · non-contrast · 1.3mm · 0.73mm/px · z∈[-92,+93]mm · 6 of 144 slices shown]
[im 1/144]
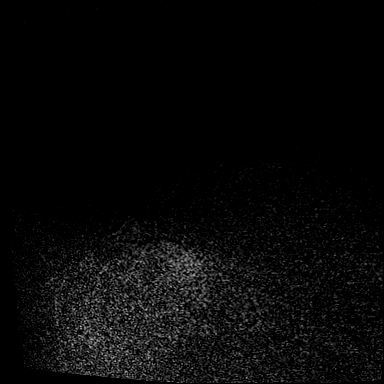
[im 29/144]
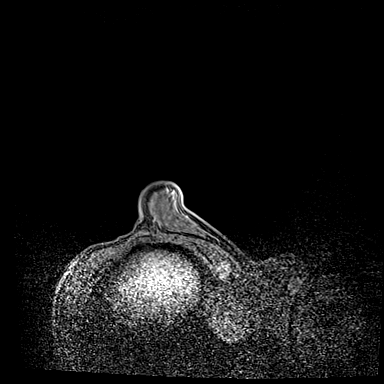
[im 58/144]
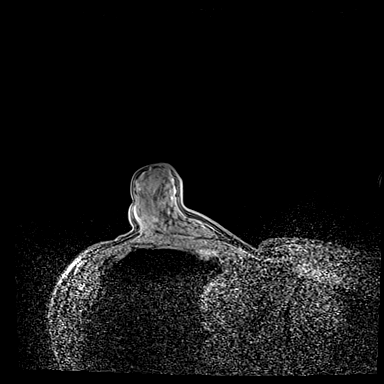
[im 86/144]
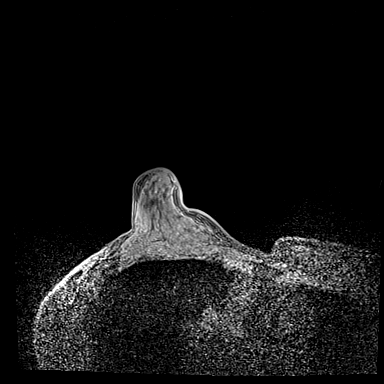
[im 115/144]
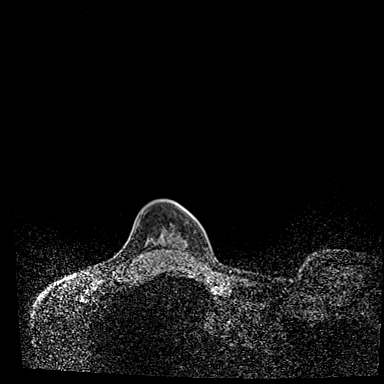
[im 144/144]
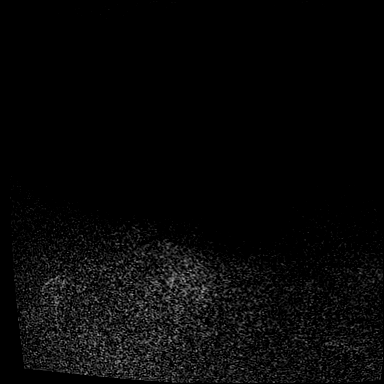

[Series 5: dynamic post 20 · axial · 1.3mm · 0.73mm/px · z∈[-92,+93]mm · 6 of 144 slices shown (1 of 2)]
[im 1/144]
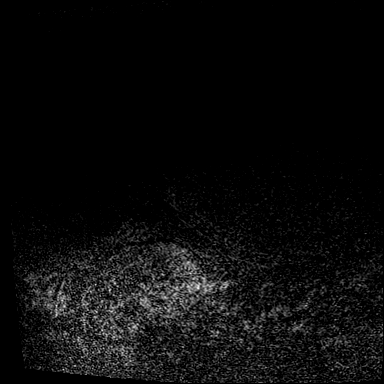
[im 29/144]
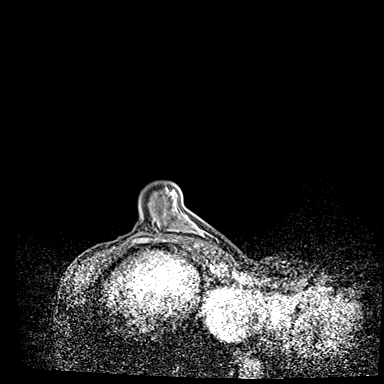
[im 58/144]
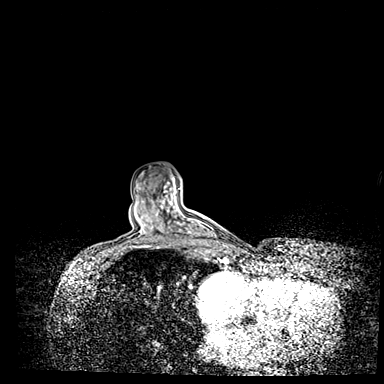
[im 86/144]
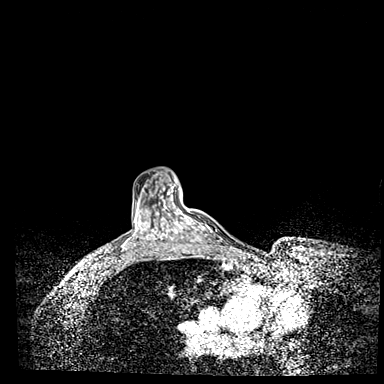
[im 115/144]
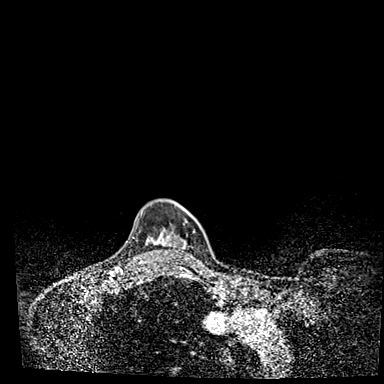
[im 144/144]
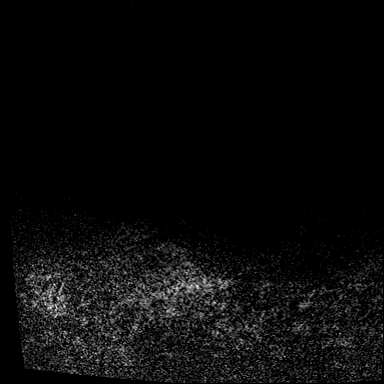

[Series 6: dynamic post 20 · axial · 1.3mm · 0.73mm/px · z∈[-92,+93]mm · 7 of 144 slices shown (2 of 2)]
[im 1/144]
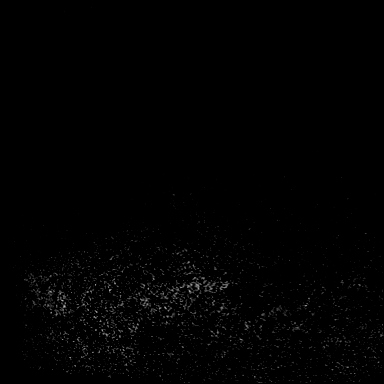
[im 24/144]
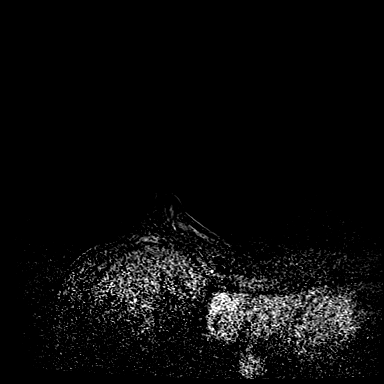
[im 48/144]
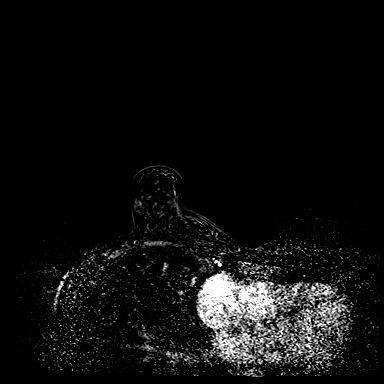
[im 72/144]
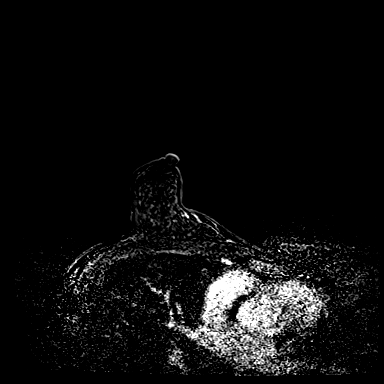
[im 96/144]
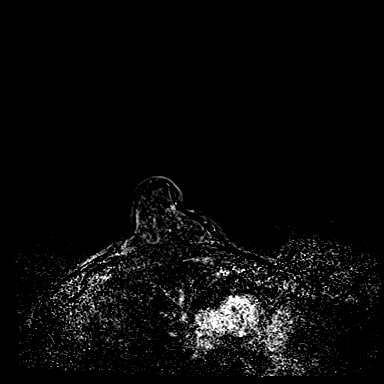
[im 120/144]
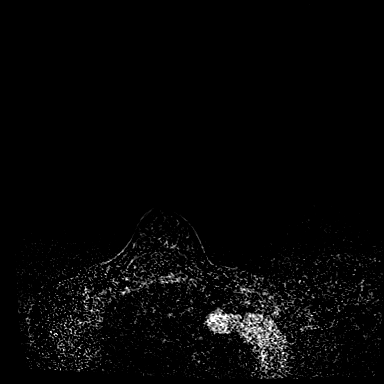
[im 144/144]
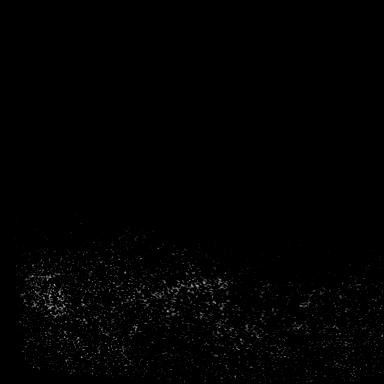

[Series 7: dynamic post 3 · axial · 1.3mm · 0.73mm/px · z∈[-92,+93]mm · 7 of 144 slices shown (1 of 2)]
[im 1/144]
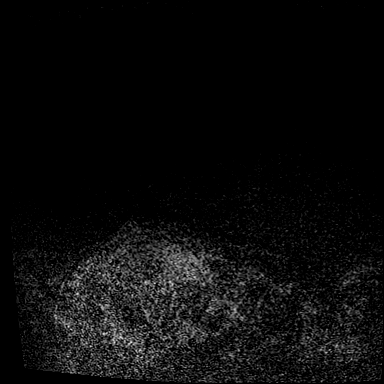
[im 24/144]
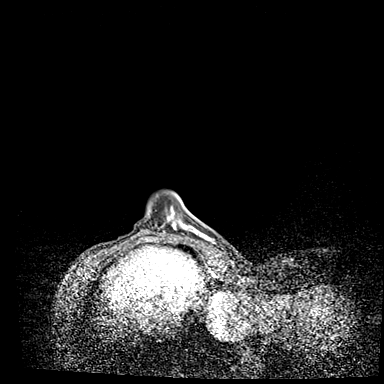
[im 48/144]
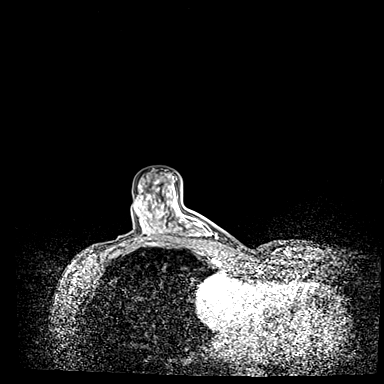
[im 72/144]
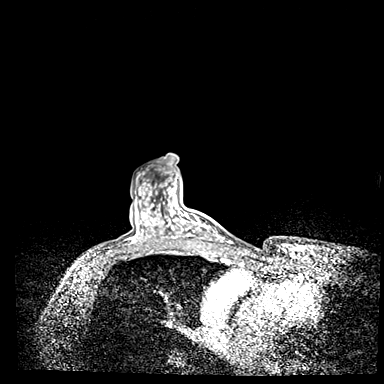
[im 96/144]
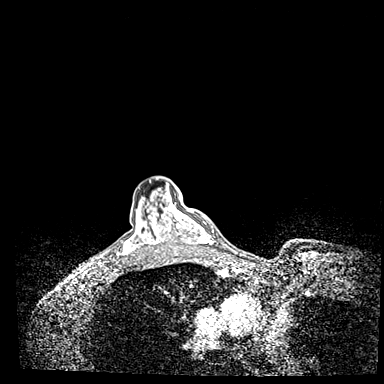
[im 120/144]
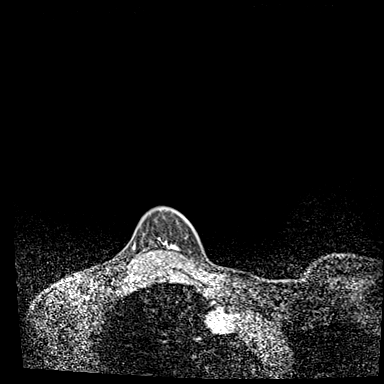
[im 144/144]
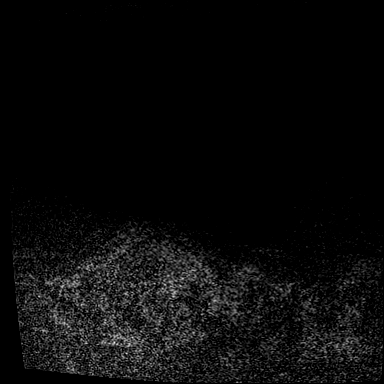

[Series 8: dynamic post 3 · axial · 1.3mm · 0.73mm/px · z∈[-92,+93]mm · 7 of 144 slices shown (2 of 2)]
[im 1/144]
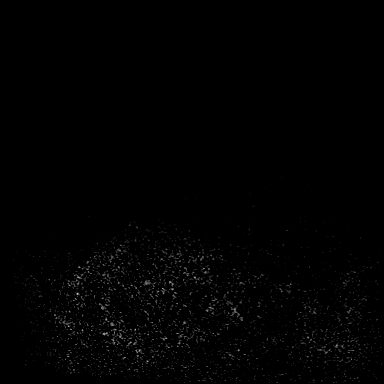
[im 24/144]
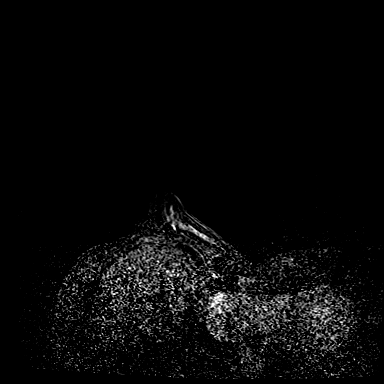
[im 48/144]
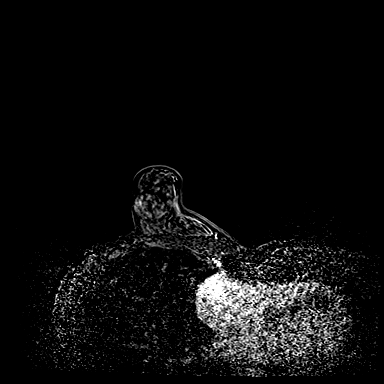
[im 72/144]
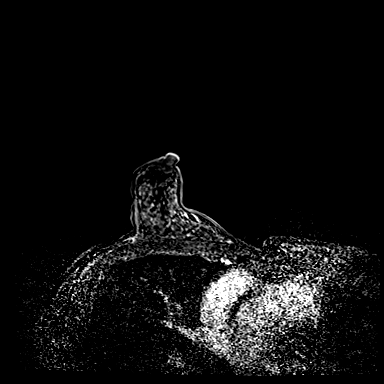
[im 96/144]
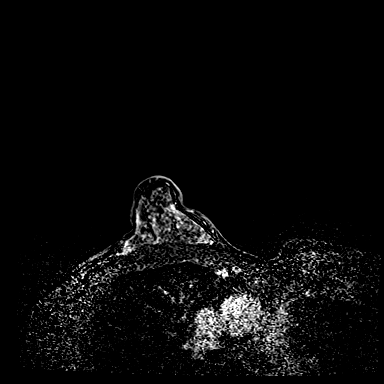
[im 120/144]
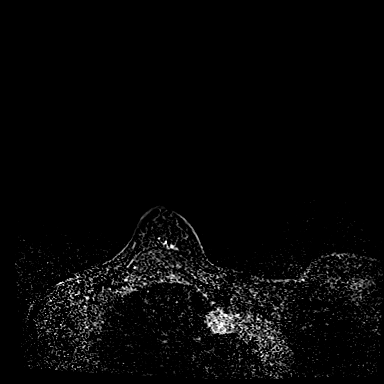
[im 144/144]
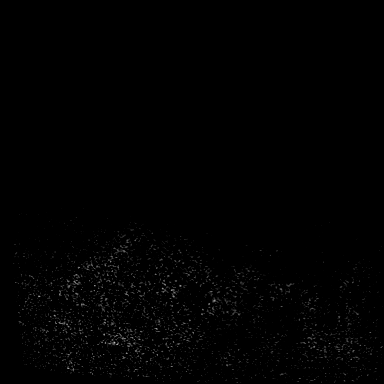

[34 of 48 positions shown; findings below may reference images not displayed]

Three-dimensional MR images were rendered by post-processing of the
original MR data on an independent workstation. The
three-dimensional MR images were interpreted, and findings are
reported in the following complete MRI report for this study. Three
dimensional images were evaluated at the independent DynaCad
workstation
FINDINGS: Breast composition: d. Extreme fibroglandular tissue.

Background parenchymal enhancement: Mild

Right breast: Normal appearing fibroglandular tissue throughout the
right breast. The previously demonstrated 7 x 7 mm irregular
enhancing mass in the inferior right breast is no longer seen.

Lymph nodes: No abnormal appearing lymph nodes.

Ancillary findings:  None.
IMPRESSION: The 7 mm irregular enhancing mass seen in the inferior right breast
on the MRI dated [DATE] is no longer visualized. This most
likely represented hormonally stimulated normal fibroglandular
tissue or fibrocystic tissue. Since this was not visualized today,
MR guided needle biopsy was not performed.

RECOMMENDATION:
Follow-up bilateral breast MRI with and without contrast in 6
months. This has been discussed with the patient.

BI-RADS CATEGORY  1: Negative.

## 2021-06-15 MED ORDER — GADOBUTROL 1 MMOL/ML IV SOLN
5.0000 mL | Freq: Once | INTRAVENOUS | Status: AC | PRN
  Administered 2021-06-15: 5 mL via INTRAVENOUS

## 2021-10-26 ENCOUNTER — Other Ambulatory Visit: Payer: Self-pay | Admitting: Obstetrics and Gynecology

## 2021-10-26 DIAGNOSIS — Z803 Family history of malignant neoplasm of breast: Secondary | ICD-10-CM

## 2021-10-27 ENCOUNTER — Other Ambulatory Visit: Payer: Self-pay | Admitting: Obstetrics and Gynecology

## 2021-10-27 DIAGNOSIS — N6019 Diffuse cystic mastopathy of unspecified breast: Secondary | ICD-10-CM

## 2021-12-02 ENCOUNTER — Ambulatory Visit
Admission: RE | Admit: 2021-12-02 | Discharge: 2021-12-02 | Disposition: A | Discharge status: Home / Self Care | Payer: 59 | Source: Ambulatory Visit | Attending: Obstetrics and Gynecology | Admitting: Obstetrics and Gynecology

## 2021-12-02 DIAGNOSIS — N6019 Diffuse cystic mastopathy of unspecified breast: Secondary | ICD-10-CM

## 2021-12-02 MED ORDER — GADOBUTROL 1 MMOL/ML IV SOLN
6.0000 mL | Freq: Once | INTRAVENOUS | Status: AC | PRN
  Administered 2021-12-02: 6 mL via INTRAVENOUS

## 2022-09-06 ENCOUNTER — Other Ambulatory Visit: Payer: Self-pay | Admitting: Obstetrics and Gynecology

## 2022-09-06 DIAGNOSIS — Z803 Family history of malignant neoplasm of breast: Secondary | ICD-10-CM

## 2022-09-20 ENCOUNTER — Other Ambulatory Visit: Payer: 59

## 2022-12-04 ENCOUNTER — Ambulatory Visit
Admission: RE | Admit: 2022-12-04 | Discharge: 2022-12-04 | Disposition: A | Discharge status: Home / Self Care | Payer: 59 | Source: Ambulatory Visit | Attending: Obstetrics and Gynecology | Admitting: Obstetrics and Gynecology

## 2022-12-04 DIAGNOSIS — Z803 Family history of malignant neoplasm of breast: Secondary | ICD-10-CM

## 2022-12-04 MED ORDER — GADOPICLENOL 0.5 MMOL/ML IV SOLN
6.0000 mL | Freq: Once | INTRAVENOUS | Status: AC | PRN
  Administered 2022-12-04: 6 mL via INTRAVENOUS

## 2024-04-30 NOTE — H&P (Signed)
 Subjective Patient ID: [name] is a 43 y.o. female.     HPI   Returns for follow up discussion breast reconstruction, last seen 09/2023. Patient's mother, who is my patient, had history breast cancer bilateral and negative genetic testing. Patient's own risk calculated at 30% lifetime on Tyrer-Cuzick and approximately 20% by Alisa model. She has been receiving screening with MMG and MRI. Patient planning bilateral risk reducing mastectomies. She has discussed NSM with Dr. Ebbie.   MMG 06/2023 benign MRI 11/2022 benign   Patient underwent breast augmentation 2014 saline subglandular by Dr. Mercer. Patient felt she was too large and was planning for downsize. Dr. Verneda recommended to leave implants out for several months to assess whether mastopexy needed. Implants were removed 2021. During this time, her mother was diagnosed with cancer and patient decided to leave implants out. Current 32 B   Patient has known pectus excavatum.   SAHM. Lives with spouse and two teenage kids.    Review of Systems  All other systems reviewed and are negative.    Objective Physical Exam  Cardiovascular: Normal rate, regular rhythm and normal heart sounds.    Pulmonary/Chest Effort normal and breath sounds normal.    Skin   Fitzpatrick 2   Abd: minimal soft tissue for reconstruction   Lymph: no palpable axillary adenopathy   Pectus excavatum present Breasts: no palpable masses, no ptosis SN to nipple R 19 L 20 cm BW R 16 L 16 CW 12.5 cm Nipple to IMF R 8 L 8 cm Faded IMF scars   Assessment/Plan Family history of breast cancer At high risk for breast cancer   Plan bilateral NSM with tissue expander acellular dermis reconstruction.   Reviewed risks mastectomy flap necrosis requiring additional surgery.  Reviewed drains, OR length, hospital stay and recovery, limitations. Discussed process of expansion and implant based risks including rupture, imaging surveillance for  silicone implants, infection requiring surgery or removal, contracture. Reviewed implants are not permanent devices and will require additional surgery.  Counseled expander placement at time of mastectomy does not preclude her from autologous reconstruction in future. However, patient has minimal soft tissue donor sites for purely autologous reconstruction. If she desires this recommend she consult with one of my microsurgery colleagues.   Discussed use of acellular dermis in reconstruction, cadaveric source, incorporation over several weeks, risk that if has seroma or infection can act as additional nidus for infection if not incorporated.    Discussed prepectoral vs sub pectoral reconstruction. Discussed with patient and benefit of this is no animation deformity, may be less pain. Risk may be more visible rippling over upper poles, greater need of ADM. Reviewed pre pectoral would require larger amount acellular dermis, more drains. Discussed any type reconstruction also risks long term displacement implant and visible rippling. If prepectoral counseled I would recommend she be comfortable with silicone implants as more options that have less rippling. She agrees to prepectoral placement.   Reviewed reconstruction will be asensate and not stimulate. Reviewed additional risks including but not limited to risks seroma, hematoma, asymmetry, need to additional procedures, fat necrosis, DVT/PE, damage to adjacent structures, cardiopulmonary complications.   Drain teaching completed. Rx for Norco and Bactrim given. Reports cannot tolerate NSAIDs, cannot tolerate any muscle relaxant, cannot tolerate oxycodone  nor tramadol .   Earlis Ranks, MD [facility]  Office/ physician access line after hours [phone]

## 2024-05-06 ENCOUNTER — Encounter (HOSPITAL_BASED_OUTPATIENT_CLINIC_OR_DEPARTMENT_OTHER): Payer: Self-pay | Admitting: General Surgery

## 2024-05-06 ENCOUNTER — Other Ambulatory Visit: Payer: Self-pay

## 2024-05-06 NOTE — Progress Notes (Signed)
   05/06/24 1555  PAT Phone Screen  Is the patient taking a GLP-1 receptor agonist? No  Do You Have Diabetes? No  Do You Have Hypertension? No  Have You Ever Been to the ER for Asthma? No  Have You Taken Oral Steroids in the Past 3 Months? No  Do you Take Phenteramine or any Other Diet Drugs? No  Recent  Lab Work, EKG, CXR? Yes  Where was this test performed? EKG 12/26/23  Do you have a history of heart problems? No  Cardiologist Name [facility] Dr Launie 12/26/23 for JVD and palpitations - normal work up f/u prn  Have you ever had tests on your heart? Yes  What cardiac tests were performed? Echo  What date/year were cardiac tests completed? EF 55-60% 01/15/24 Venous duplex done at [facility] w/ Vascular surgery- normal anatomy  Results viewable: Care Everywhere  Any Recent Hospitalizations? No  Height 5' 6 (1.676 m)  Weight 55 kg  Pat Appointment Scheduled No  Reason for No Appointment Not Needed

## 2024-05-07 NOTE — Progress Notes (Signed)
 Ensure presurgery drink given with written/verbal instruction to complete by 0415 DOS. CHG soap given with written/verbal instruction. Pt's husband verified understanding and will deliver to pt.         Enhanced Recovery after Surgery for Orthopedics Enhanced Recovery after Surgery is a protocol used to improve the stress on your body and your recovery after surgery.  Patient Instructions  The night before surgery:  No food after midnight. ONLY clear liquids after midnight  The day of surgery (if you do NOT have diabetes):  Drink ONE (1) Pre-Surgery Clear Ensure as directed.   This drink was given to you during your hospital  pre-op appointment visit. The pre-op nurse will instruct you on the time to drink the  Pre-Surgery Ensure depending on your surgery time. Finish the drink at the designated time by the pre-op nurse.  Nothing else to drink after completing the  Pre-Surgery Clear Ensure.  The day of surgery (if you have diabetes): Drink ONE (1) Gatorade 2 (G2) as directed. This drink was given to you during your hospital  pre-op appointment visit.  The pre-op nurse will instruct you on the time to drink the   Gatorade 2 (G2) depending on your surgery time. Color of the Gatorade may vary. Red is not allowed. Nothing else to drink after completing the  Gatorade 2 (G2).         If you have questions, please contact your surgeon's office.

## 2024-05-12 ENCOUNTER — Other Ambulatory Visit: Payer: Self-pay | Admitting: General Surgery

## 2024-05-12 NOTE — H&P (Signed)
  43 year old female who has a family history of breast cancer in her mom at age 25. Her mom's genetic testing was negative. Her mom actually had bilateral breast cancer. She has no biopsies but she has a history of a number of cysts that have been present. She due to her risk that was calculated in 2022 at 25.6% lifetime on Tyrer-Cuzick has been getting staggered mammograms and MRIs. Her last MR is in April 2024 and is negative. Her last mammogram is November 2024 and this is negative as well. She has D density breast tissue she has a significant amount of breast pain and has a lot of issues with her exams. She does have a history of breast implants that were removed in 2021. She has no discharge noted today. She has multiple masses that she states have been present for a long time. I see her earlier this year and calculated TC to be 30%. She has had no changes since then.   Review of Systems: A complete review of systems was obtained from the patient. I have reviewed this information and discussed as appropriate with the patient. See HPI as well for other ROS.  Review of Systems  All other systems reviewed and are negative.   Medical History: History reviewed. No pertinent past medical history.  Past Surgical History:  Procedure Laterality Date  Breast surgery  2014, 2021  CESAREAN SECTION  HYSTERECTOMY  SEPTOPLASTY  TONSILLECTOMY  Tumor removal surgery    Allergies  Allergen Reactions  Latex Itching and Rash   Current Outpatient Medications on File Prior to Visit  Medication Sig Dispense Refill  cholecalciferol 1000 unit tablet 50 tablets   Family History  Problem Relation Age of Onset  Breast cancer Mother    Social History   Tobacco Use  Smoking Status Never  Smokeless Tobacco Never  Marital status: Married  Tobacco Use  Smoking status: Never  Smokeless tobacco: Never  Substance and Sexual Activity  Alcohol use: Never  Drug use: Never  Sexual activity: Defer     Objective:   Physical Exam Vitals reviewed.  Constitutional:  Appearance: Normal appearance.  Chest:  Breasts: Right: No inverted nipple, mass or nipple discharge.  Left: No inverted nipple, mass or nipple discharge.  Lymphadenopathy:  Upper Body:  Right upper body: No supraclavicular or axillary adenopathy.  Left upper body: No supraclavicular or axillary adenopathy.  Neurological:  Mental Status: She is alert.   Assessment and Plan:   Family history of breast cancer in mother, dense breast tissue, elevated TC risk  Bilateral risk reducing nipple sparing mastectomies  I think reasonable given her history, dense breast tissue as well as her risk level to discuss bilateral mastectomies. She is knows that this is not 100% preventative and there is some small chance that you could have cancer after mastectomy. We discussed nipple sparing mastectomy with the risks associated with that. She is going to have expander reconstruction at the same time. We are just going to proceed with surgery and not obtain any additional imaging and that at this point and I think that is not unreasonable.

## 2024-05-12 NOTE — Anesthesia Preprocedure Evaluation (Signed)
 Anesthesia Evaluation  Patient identified by MRN, date of birth, ID band Patient awake    Reviewed: Allergy & Precautions, H&P , NPO status , Patient's Chart, lab work & pertinent test results  History of Anesthesia Complications (+) PONV and history of anesthetic complications  Airway Mallampati: I  TM Distance: >3 FB Neck ROM: Full    Dental no notable dental hx. (+) Teeth Intact, Dental Advisory Given   Pulmonary neg pulmonary ROS   Pulmonary exam normal breath sounds clear to auscultation       Cardiovascular Exercise Tolerance: Good negative cardio ROS Normal cardiovascular exam Rhythm:Regular Rate:Normal     Neuro/Psych negative neurological ROS  negative psych ROS   GI/Hepatic negative GI ROS, Neg liver ROS,GERD  ,,  Endo/Other  negative endocrine ROS    Renal/GU negative Renal ROS  negative genitourinary   Musculoskeletal negative musculoskeletal ROS (+)    Abdominal   Peds negative pediatric ROS (+)  Hematology negative hematology ROS (+)   Anesthesia Other Findings   Reproductive/Obstetrics negative OB ROS                              Anesthesia Physical Anesthesia Plan  ASA: 2  Anesthesia Plan: General and Regional   Post-op Pain Management: Tylenol  PO (pre-op)*, Celebrex  PO (pre-op)*, Minimal or no pain anticipated, Regional block* and Precedex    Induction: Intravenous  PONV Risk Score and Plan: 3 and Ondansetron , Dexamethasone , Treatment may vary due to age or medical condition, Midazolam  and TIVA  Airway Management Planned: Oral ETT and LMA  Additional Equipment: None  Intra-op Plan:   Post-operative Plan: Extubation in OR  Informed Consent: I have reviewed the patients History and Physical, chart, labs and discussed the procedure including the risks, benefits and alternatives for the proposed anesthesia with the patient or authorized representative who has  indicated his/her understanding and acceptance.     Dental advisory given  Plan Discussed with: Anesthesiologist and CRNA  Anesthesia Plan Comments: (  )         Anesthesia Quick Evaluation

## 2024-05-13 ENCOUNTER — Other Ambulatory Visit: Payer: Self-pay

## 2024-05-13 ENCOUNTER — Encounter (HOSPITAL_BASED_OUTPATIENT_CLINIC_OR_DEPARTMENT_OTHER)
Admission: RE | Disposition: A | Discharge status: Home / Self Care | Payer: Self-pay | Source: Home / Self Care | Attending: General Surgery

## 2024-05-13 ENCOUNTER — Observation Stay (HOSPITAL_BASED_OUTPATIENT_CLINIC_OR_DEPARTMENT_OTHER)
Admission: RE | Admit: 2024-05-13 | Discharge: 2024-05-14 | Disposition: A | Discharge status: Home / Self Care | Attending: General Surgery | Admitting: General Surgery

## 2024-05-13 ENCOUNTER — Ambulatory Visit (HOSPITAL_BASED_OUTPATIENT_CLINIC_OR_DEPARTMENT_OTHER): Admitting: Anesthesiology

## 2024-05-13 DIAGNOSIS — N6032 Fibrosclerosis of left breast: Secondary | ICD-10-CM | POA: Insufficient documentation

## 2024-05-13 DIAGNOSIS — D242 Benign neoplasm of left breast: Secondary | ICD-10-CM | POA: Diagnosis present

## 2024-05-13 DIAGNOSIS — Z91040 Latex allergy status: Secondary | ICD-10-CM | POA: Insufficient documentation

## 2024-05-13 DIAGNOSIS — Z803 Family history of malignant neoplasm of breast: Secondary | ICD-10-CM

## 2024-05-13 DIAGNOSIS — Z9013 Acquired absence of bilateral breasts and nipples: Principal | ICD-10-CM

## 2024-05-13 DIAGNOSIS — N6031 Fibrosclerosis of right breast: Secondary | ICD-10-CM | POA: Insufficient documentation

## 2024-05-13 HISTORY — PX: BREAST RECONSTRUCTION: SHX9

## 2024-05-13 HISTORY — PX: NIPPLE SPARING MASTECTOMY: SHX6537

## 2024-05-13 HISTORY — DX: Other specified postprocedural states: Z98.890

## 2024-05-13 HISTORY — DX: Gastro-esophageal reflux disease without esophagitis: K21.9

## 2024-05-13 SURGERY — MASTECTOMY, NIPPLE SPARING
Anesthesia: Regional | Site: Breast | Laterality: Bilateral

## 2024-05-13 MED ORDER — PROPOFOL 10 MG/ML IV BOLUS
INTRAVENOUS | Status: DC | PRN
  Administered 2024-05-13: 150 mg via INTRAVENOUS

## 2024-05-13 MED ORDER — PROPOFOL 500 MG/50ML IV EMUL
INTRAVENOUS | Status: DC | PRN
  Administered 2024-05-13: 150 ug/kg/min via INTRAVENOUS
  Administered 2024-05-13: 75 ug/kg/min via INTRAVENOUS

## 2024-05-13 MED ORDER — HYDROCODONE-ACETAMINOPHEN 5-325 MG PO TABS
1.0000 | ORAL_TABLET | ORAL | Status: DC | PRN
  Administered 2024-05-13 – 2024-05-14 (×4): 1 via ORAL
  Filled 2024-05-13 (×4): qty 1

## 2024-05-13 MED ORDER — SUGAMMADEX SODIUM 200 MG/2ML IV SOLN
INTRAVENOUS | Status: DC | PRN
  Administered 2024-05-13: 200 mg via INTRAVENOUS

## 2024-05-13 MED ORDER — MIDAZOLAM HCL 2 MG/2ML IJ SOLN
INTRAMUSCULAR | Status: AC
  Filled 2024-05-13: qty 2

## 2024-05-13 MED ORDER — ROCURONIUM BROMIDE 100 MG/10ML IV SOLN
INTRAVENOUS | Status: DC | PRN
  Administered 2024-05-13: 50 mg via INTRAVENOUS

## 2024-05-13 MED ORDER — TRANEXAMIC ACID 1000 MG/10ML IV SOLN
Status: DC | PRN
  Administered 2024-05-13 (×2): 3000 mg via TOPICAL

## 2024-05-13 MED ORDER — ROCURONIUM BROMIDE 10 MG/ML (PF) SYRINGE
PREFILLED_SYRINGE | INTRAVENOUS | Status: AC
  Filled 2024-05-13: qty 20

## 2024-05-13 MED ORDER — FENTANYL CITRATE (PF) 100 MCG/2ML IJ SOLN
INTRAMUSCULAR | Status: AC
  Filled 2024-05-13: qty 2

## 2024-05-13 MED ORDER — MIDAZOLAM HCL 2 MG/2ML IJ SOLN
2.0000 mg | Freq: Once | INTRAMUSCULAR | Status: AC
  Administered 2024-05-13: 2 mg via INTRAVENOUS

## 2024-05-13 MED ORDER — SIMETHICONE 80 MG PO CHEW: 40.0000 mg | CHEWABLE_TABLET | Freq: Four times a day (QID) | ORAL | Status: DC | PRN

## 2024-05-13 MED ORDER — ONDANSETRON HCL 4 MG/2ML IJ SOLN: 4.0000 mg | Freq: Four times a day (QID) | INTRAMUSCULAR | Status: DC | PRN

## 2024-05-13 MED ORDER — EPHEDRINE 5 MG/ML INJ
INTRAVENOUS | Status: AC
  Filled 2024-05-13: qty 5

## 2024-05-13 MED ORDER — EPHEDRINE SULFATE (PRESSORS) 50 MG/ML IJ SOLN
INTRAMUSCULAR | Status: DC | PRN
  Administered 2024-05-13 (×7): 5 mg via INTRAVENOUS

## 2024-05-13 MED ORDER — CELECOXIB 200 MG PO CAPS: 200.0000 mg | ORAL_CAPSULE | Freq: Once | ORAL | Status: DC

## 2024-05-13 MED ORDER — LIDOCAINE 2% (20 MG/ML) 5 ML SYRINGE
INTRAMUSCULAR | Status: DC | PRN
  Administered 2024-05-13: 40 mg via INTRAVENOUS

## 2024-05-13 MED ORDER — CEFAZOLIN SODIUM-DEXTROSE 1-4 GM/50ML-% IV SOLN
1.0000 g | Freq: Three times a day (TID) | INTRAVENOUS | Status: DC
  Administered 2024-05-13 (×2): 1 g via INTRAVENOUS
  Filled 2024-05-13 (×2): qty 50

## 2024-05-13 MED ORDER — ACETAMINOPHEN 500 MG PO TABS
ORAL_TABLET | ORAL | Status: AC
Start: 2024-05-13 — End: 2024-05-13
  Filled 2024-05-13: qty 2

## 2024-05-13 MED ORDER — FENTANYL CITRATE (PF) 100 MCG/2ML IJ SOLN
25.0000 ug | INTRAMUSCULAR | Status: DC | PRN
  Administered 2024-05-13: 50 ug via INTRAVENOUS

## 2024-05-13 MED ORDER — TRANEXAMIC ACID 1000 MG/10ML IV SOLN
INTRAVENOUS | Status: AC
  Filled 2024-05-13: qty 60

## 2024-05-13 MED ORDER — ONDANSETRON HCL 4 MG/2ML IJ SOLN
INTRAMUSCULAR | Status: AC
Start: 2024-05-13 — End: 2024-05-13
  Filled 2024-05-13: qty 2

## 2024-05-13 MED ORDER — CEFAZOLIN SODIUM-DEXTROSE 2-4 GM/100ML-% IV SOLN
INTRAVENOUS | Status: AC
  Filled 2024-05-13: qty 100

## 2024-05-13 MED ORDER — DEXAMETHASONE SODIUM PHOSPHATE 10 MG/ML IJ SOLN
INTRAMUSCULAR | Status: DC | PRN
  Administered 2024-05-13: 10 mg via INTRAVENOUS

## 2024-05-13 MED ORDER — ONDANSETRON 4 MG PO TBDP: 4.0000 mg | ORAL_TABLET | Freq: Four times a day (QID) | ORAL | Status: DC | PRN

## 2024-05-13 MED ORDER — LACTATED RINGERS IV SOLN: INTRAVENOUS | Status: DC

## 2024-05-13 MED ORDER — POVIDONE-IODINE 10 % EX SOLN
CUTANEOUS | Status: DC | PRN
Start: 2024-05-13 — End: 2024-05-13
  Administered 2024-05-13: 1 via TOPICAL

## 2024-05-13 MED ORDER — CELECOXIB 200 MG PO CAPS
ORAL_CAPSULE | ORAL | Status: AC
  Filled 2024-05-13: qty 1

## 2024-05-13 MED ORDER — BUPIVACAINE HCL (PF) 0.25 % IJ SOLN
INTRAMUSCULAR | Status: DC | PRN
  Administered 2024-05-13: 25 mL via EPIDURAL
  Administered 2024-05-13: 5 mL via EPIDURAL

## 2024-05-13 MED ORDER — ACETAMINOPHEN 500 MG PO TABS: 1000.0000 mg | ORAL_TABLET | ORAL | Status: AC

## 2024-05-13 MED ORDER — DEXMEDETOMIDINE HCL IN NACL 80 MCG/20ML IV SOLN
INTRAVENOUS | Status: DC | PRN
Start: 2024-05-13 — End: 2024-05-13
  Administered 2024-05-13 (×3): 4 ug via INTRAVENOUS

## 2024-05-13 MED ORDER — BUPIVACAINE LIPOSOME 1.3 % IJ SUSP
INTRAMUSCULAR | Status: DC | PRN
  Administered 2024-05-13 (×2): 5 mL via PERINEURAL

## 2024-05-13 MED ORDER — SODIUM CHLORIDE 0.9 % IV SOLN
INTRAVENOUS | Status: DC | PRN
  Administered 2024-05-13: 500 mL

## 2024-05-13 MED ORDER — 0.9 % SODIUM CHLORIDE (POUR BTL) OPTIME
TOPICAL | Status: DC | PRN
Start: 2024-05-13 — End: 2024-05-13
  Administered 2024-05-13: 1000 mL

## 2024-05-13 MED ORDER — ATROPINE SULFATE 0.4 MG/ML IV SOLN
INTRAVENOUS | Status: AC
Start: 2024-05-13 — End: 2024-05-13
  Filled 2024-05-13: qty 1

## 2024-05-13 MED ORDER — ONDANSETRON HCL 4 MG/2ML IJ SOLN: 4.0000 mg | Freq: Once | INTRAMUSCULAR | Status: DC | PRN

## 2024-05-13 MED ORDER — PROPOFOL 10 MG/ML IV BOLUS
INTRAVENOUS | Status: AC
Start: 2024-05-13 — End: 2024-05-13
  Filled 2024-05-13: qty 20

## 2024-05-13 MED ORDER — FENTANYL CITRATE (PF) 100 MCG/2ML IJ SOLN
INTRAMUSCULAR | Status: DC | PRN
  Administered 2024-05-13: 50 ug via INTRAVENOUS

## 2024-05-13 MED ORDER — HYDROMORPHONE HCL 1 MG/ML IJ SOLN: 0.5000 mg | INTRAMUSCULAR | Status: DC | PRN

## 2024-05-13 MED ORDER — ONDANSETRON HCL 4 MG/2ML IJ SOLN
INTRAMUSCULAR | Status: DC | PRN
  Administered 2024-05-13: 4 mg via INTRAVENOUS

## 2024-05-13 MED ORDER — PROPOFOL 10 MG/ML IV BOLUS
INTRAVENOUS | Status: AC
  Filled 2024-05-13: qty 20

## 2024-05-13 MED ORDER — KETOROLAC TROMETHAMINE 30 MG/ML IJ SOLN
INTRAMUSCULAR | Status: DC | PRN
Start: 2024-05-13 — End: 2024-05-13
  Administered 2024-05-13: 27 mg via INTRAVENOUS

## 2024-05-13 MED ORDER — DEXMEDETOMIDINE HCL IN NACL 80 MCG/20ML IV SOLN
INTRAVENOUS | Status: AC
Start: 2024-05-13 — End: 2024-05-13
  Filled 2024-05-13: qty 20

## 2024-05-13 MED ORDER — DEXAMETHASONE SODIUM PHOSPHATE 10 MG/ML IJ SOLN
INTRAMUSCULAR | Status: AC
Start: 2024-05-13 — End: 2024-05-13
  Filled 2024-05-13: qty 1

## 2024-05-13 MED ORDER — GENTAMICIN SULFATE 40 MG/ML IJ SOLN
INTRAMUSCULAR | Status: AC
  Filled 2024-05-13: qty 10

## 2024-05-13 MED ORDER — CEFAZOLIN SODIUM-DEXTROSE 2-4 GM/100ML-% IV SOLN
2.0000 g | INTRAVENOUS | Status: AC
  Administered 2024-05-13: 2 g via INTRAVENOUS

## 2024-05-13 MED ORDER — FENTANYL CITRATE (PF) 100 MCG/2ML IJ SOLN
100.0000 ug | Freq: Once | INTRAMUSCULAR | Status: AC
  Administered 2024-05-13: 50 ug via INTRAVENOUS

## 2024-05-13 MED ORDER — LIDOCAINE 2% (20 MG/ML) 5 ML SYRINGE
INTRAMUSCULAR | Status: AC
  Filled 2024-05-13: qty 5

## 2024-05-13 MED ORDER — OXYCODONE HCL 5 MG PO TABS: 5.0000 mg | ORAL_TABLET | Freq: Once | ORAL | Status: DC | PRN

## 2024-05-13 MED ORDER — SODIUM CHLORIDE 0.9 % IV SOLN: INTRAVENOUS | Status: DC

## 2024-05-13 MED ORDER — OXYCODONE HCL 5 MG/5ML PO SOLN: 5.0000 mg | Freq: Once | ORAL | Status: DC | PRN

## 2024-05-13 MED ORDER — ACETAMINOPHEN 500 MG PO TABS
1000.0000 mg | ORAL_TABLET | Freq: Once | ORAL | Status: DC
Start: 2024-05-13 — End: 2024-05-13

## 2024-05-13 MED ORDER — MEPERIDINE HCL 25 MG/ML IJ SOLN: 6.2500 mg | INTRAMUSCULAR | Status: DC | PRN

## 2024-05-13 SURGICAL SUPPLY — 86 items
ALLOGRAFT PERF 16X20 1.6+/-0.4 (Tissue) IMPLANT
BAG DECANTER FOR FLEXI CONT (MISCELLANEOUS) ×1 IMPLANT
BENZOIN TINCTURE PRP APPL 2/3 (GAUZE/BANDAGES/DRESSINGS) IMPLANT
BINDER BREAST LRG (GAUZE/BANDAGES/DRESSINGS) IMPLANT
BINDER BREAST MEDIUM (GAUZE/BANDAGES/DRESSINGS) IMPLANT
BINDER BREAST XLRG (GAUZE/BANDAGES/DRESSINGS) IMPLANT
BINDER BREAST XXLRG (GAUZE/BANDAGES/DRESSINGS) IMPLANT
BLADE CLIPPER SURG (BLADE) IMPLANT
BLADE HEX COATED 2.75 (ELECTRODE) ×1 IMPLANT
BLADE SURG 10 STRL SS (BLADE) ×1 IMPLANT
BLADE SURG 15 STRL LF DISP TIS (BLADE) ×1 IMPLANT
BNDG GAUZE DERMACEA FLUFF 4 (GAUZE/BANDAGES/DRESSINGS) ×2 IMPLANT
CANISTER SUCT 1200ML W/VALVE (MISCELLANEOUS) ×1 IMPLANT
CHLORAPREP W/TINT 26 (MISCELLANEOUS) ×1 IMPLANT
CLIP APPLIE 11 MED OPEN (CLIP) ×1 IMPLANT
CLIP APPLIE 9.375 MED OPEN (MISCELLANEOUS) IMPLANT
CLIP TI WIDE RED SMALL 6 (CLIP) IMPLANT
COVER BACK TABLE 60X90IN (DRAPES) ×1 IMPLANT
COVER MAYO STAND STRL (DRAPES) ×2 IMPLANT
COVER PROBE CYLINDRICAL 5X96 (MISCELLANEOUS) ×1 IMPLANT
DERMABOND ADVANCED .7 DNX12 (GAUZE/BANDAGES/DRESSINGS) ×2 IMPLANT
DRAIN CHANNEL 15F RND FF W/TCR (WOUND CARE) ×1 IMPLANT
DRAIN CHANNEL 19F RND (DRAIN) IMPLANT
DRAPE INCISE IOBAN 66X45 STRL (DRAPES) IMPLANT
DRAPE TOP ARMCOVERS (MISCELLANEOUS) ×1 IMPLANT
DRAPE U-SHAPE 76X120 STRL (DRAPES) ×1 IMPLANT
DRAPE UTILITY XL STRL (DRAPES) ×1 IMPLANT
DRSG TEGADERM 4X10 (GAUZE/BANDAGES/DRESSINGS) IMPLANT
DRSG TEGADERM 4X4.75 (GAUZE/BANDAGES/DRESSINGS) IMPLANT
DURAPREP 26ML APPLICATOR (WOUND CARE) IMPLANT
ELECT BLADE 6.5 EXT (BLADE) IMPLANT
ELECT COATED BLADE 2.86 ST (ELECTRODE) ×1 IMPLANT
ELECTRODE BLDE 4.0 EZ CLN MEGD (MISCELLANEOUS) ×1 IMPLANT
ELECTRODE REM PT RTRN 9FT ADLT (ELECTROSURGICAL) ×1 IMPLANT
EVACUATOR SILICONE 100CC (DRAIN) ×1 IMPLANT
EXPANDER TISSUE FORTE 300CC (Breast) IMPLANT
GAUZE PAD ABD 8X10 STRL (GAUZE/BANDAGES/DRESSINGS) ×2 IMPLANT
GAUZE SPONGE 4X4 12PLY STRL (GAUZE/BANDAGES/DRESSINGS) ×1 IMPLANT
GLOVE BIOGEL PI IND STRL 7.0 (GLOVE) IMPLANT
GLOVE BIOGEL PI IND STRL 7.5 (GLOVE) ×1 IMPLANT
GLOVE SURG SS PI 6.0 STRL IVOR (GLOVE) ×3 IMPLANT
GLOVE SURG SS PI 7.0 STRL IVOR (GLOVE) ×1 IMPLANT
GOWN STRL REUS W/ TWL LRG LVL3 (GOWN DISPOSABLE) ×3 IMPLANT
ILLUMINATOR WAVEGUIDE N/F (MISCELLANEOUS) IMPLANT
IV NS 500ML BAXH (IV SOLUTION) ×1 IMPLANT
KIT FILL ASEPTIC TRANSFER (MISCELLANEOUS) ×1 IMPLANT
KIT MARKER MARGIN INK (KITS) IMPLANT
LIGHT WAVEGUIDE WIDE FLAT (MISCELLANEOUS) IMPLANT
MARKER SKIN DUAL TIP RULER LAB (MISCELLANEOUS) IMPLANT
NDL HYPO 25X1 1.5 SAFETY (NEEDLE) IMPLANT
NDL SAFETY ECLIPSE 18X1.5 (NEEDLE) IMPLANT
NEEDLE HYPO 25X1 1.5 SAFETY (NEEDLE) IMPLANT
NS IRRIG 1000ML POUR BTL (IV SOLUTION) ×1 IMPLANT
PACK BASIN DAY SURGERY FS (CUSTOM PROCEDURE TRAY) ×1 IMPLANT
PENCIL SMOKE EVACUATOR (MISCELLANEOUS) ×1 IMPLANT
PIN SAFETY STERILE (MISCELLANEOUS) ×1 IMPLANT
PUNCH BIOPSY 4MM DISP (MISCELLANEOUS) IMPLANT
SHEET MEDIUM DRAPE 40X70 STRL (DRAPES) ×1 IMPLANT
SLEEVE SCD COMPRESS KNEE MED (STOCKING) ×1 IMPLANT
SPIKE FLUID TRANSFER (MISCELLANEOUS) IMPLANT
SPONGE T-LAP 18X18 ~~LOC~~+RFID (SPONGE) ×2 IMPLANT
STAPLER SKIN PROX WIDE 3.9 (STAPLE) IMPLANT
STRIP CLOSURE SKIN 1/2X4 (GAUZE/BANDAGES/DRESSINGS) IMPLANT
SUT CHROMIC 4 0 SH 27 (SUTURE) IMPLANT
SUT ETHIBOND 2-0 V-5 NDL (SUTURE) IMPLANT
SUT ETHIBOND 2-0 V-5 NEEDLE (SUTURE) IMPLANT
SUT ETHILON 2 0 FS 18 (SUTURE) IMPLANT
SUT ETHILON 3 0 PS 1 (SUTURE) IMPLANT
SUT MNCRL AB 3-0 PS2 18 (SUTURE) IMPLANT
SUT MNCRL AB 4-0 PS2 18 (SUTURE) ×1 IMPLANT
SUT PDS AB 2-0 CT2 27 (SUTURE) IMPLANT
SUT SILK 2 0 SH (SUTURE) IMPLANT
SUT VIC AB 0 CT2 27 (SUTURE) IMPLANT
SUT VIC AB 3-0 SH 27X BRD (SUTURE) ×1 IMPLANT
SUT VIC AB 4-0 PS2 18 (SUTURE) ×1 IMPLANT
SUT VICRYL RAPIDE 4/0 PS 2 (SUTURE) IMPLANT
SUTURE STRATFX 0 PDS 27 VIOLET (SUTURE) IMPLANT
SYR 50ML LL SCALE MARK (SYRINGE) IMPLANT
SYR BULB IRRIG 60ML STRL (SYRINGE) ×2 IMPLANT
SYR CONTROL 10ML LL (SYRINGE) IMPLANT
TAPE MEASURE VINYL STERILE (MISCELLANEOUS) ×1 IMPLANT
TOWEL GREEN STERILE FF (TOWEL DISPOSABLE) ×2 IMPLANT
TRAY FOLEY W/BAG SLVR 14FR LF (SET/KITS/TRAYS/PACK) IMPLANT
TUBE CONNECTING 20X1/4 (TUBING) ×1 IMPLANT
UNDERPAD 30X36 HEAVY ABSORB (UNDERPADS AND DIAPERS) ×2 IMPLANT
YANKAUER SUCT BULB TIP NO VENT (SUCTIONS) ×1 IMPLANT

## 2024-05-13 NOTE — Op Note (Signed)
 Operative Note   DATE OF OPERATION: 9.16.2025  LOCATION: [facility]-observation  SURGICAL DIVISION: Plastic Surgery  PREOPERATIVE DIAGNOSES:  1. High risk breast cancer 2. Family history breast cancer  POSTOPERATIVE DIAGNOSES:  same  PROCEDURE:  1. Bilateral breast reconstruction with tissue expanders 2. Acellular dermis (Alloderm) to bilateral chest   SURGEON: Earlis Ranks MD MBA  ASSISTANT: none  ANESTHESIA:  General.   EBL: 100 ml for entire procedure  COMPLICATIONS: None immediate.   INDICATIONS FOR PROCEDURE:  The patient, [name], is a 43 y.o. female born on 08-31-1980, is here for immediate prepectoral tissue expander acellular dermis reconstruction following nipple sparing mastectomies.   FINDINGS: Natrelle 133S FV-11-T 300 ml tissue expanders placed bilateral. Initial fill volume 100 ml air bilateral. RIGHT SN [id number] LEFT SN [id number]  DESCRIPTION OF PROCEDURE:  The patient was marked with the patient in the preoperative area to mark sternal notch, chest midline, anterior axillary lines and inframammary folds. Following induction, Foley catheter placed. The patient's operative site was prepped and draped in a sterile fashion. A time out was performed and all information was confirmed to be correct. Following completion of mastectomies, reconstruction began on the left side. Hemostasis ensured. Cavity irrigated with saline followed by saline solution containing Ancef , gentamicin , and Betadine . A 19 Fr drain was placed in subcutaneous position laterally and a 15 Fr drain placed along inframammary fold. Each secured to skin with 2-0 nylon. The tissue expanders were prepared on back table prior in insertion. The expander was filled with air. Perforated acellular dermis was draped over anterior surface expander. The ADM was then secured to itself over posterior surface of expander with 4-0 chromic. Redundant folds acellular dermis excised so that the ADM lay flat  without folds over air filled expander. The expander was secured to fascia over lateral sternal border with a 0 vicryl. The lateral tab was also secured to pectoralis muscle with 0-vicryl. The ADM was secured to pectoralis muscle and chest wall along inferior border at inframammary fold with 0 Stattafix suture. Skin closure completed with 3-0 vicryl in fascial layer and 4-0 vicryl in dermis. Skin closure completed with 4-0 monocryl subcuticular and tissue adhesive.   I then directed my attention to right chest where similar irrigation and drain placement completed. The prepared expander with ADM secured over anterior surface was placed in left chest and tabs secured to chest wall and pectoralis muscle with 0- vicryl suture. The acellular dermis at inframammary fold was secured to chest wall with 0 Stattafix  suture. Skin closure completed with 3-0 vicryl in fascial layer and 4-0 vicryl in dermis. Skin closure completed with 4-0 monocryl subcuticular. The mastectomy flaps were redraped so that NAC was symmetric from sternal notch and chest midline. Patient returned to supine position. Tissue adhesive applied over all incisions. Tegaderm dressings applied followed by dry dressing, breast binder.  The patient was allowed to wake from anesthesia, extubated and taken to the recovery room in satisfactory condition.   SPECIMENS: none  DRAINS: 19 and 15 Fr JP in right and left subcutaneous chest  Earlis Ranks, MD [facility] Plastic & Reconstructive Surgery  Office/ physician access line after hours [phone]

## 2024-05-13 NOTE — Anesthesia Procedure Notes (Signed)
 Procedure Name: Intubation Date/Time: 05/13/2024 7:46 AM  Performed by: Debarah Chiquita LABOR, CRNAPre-anesthesia Checklist: Patient identified, Emergency Drugs available, Suction available and Patient being monitored Patient Re-evaluated:Patient Re-evaluated prior to induction Oxygen Delivery Method: Circle system utilized Preoxygenation: Pre-oxygenation with 100% oxygen Induction Type: IV induction Ventilation: Mask ventilation without difficulty Laryngoscope Size: Mac and 3 Grade View: Grade I Tube type: Oral Tube size: 7.0 mm Number of attempts: 1 Airway Equipment and Method: Stylet and Bite block Placement Confirmation: ETT inserted through vocal cords under direct vision, positive ETCO2 and breath sounds checked- equal and bilateral Secured at: 21 cm Tube secured with: Tape Dental Injury: Teeth and Oropharynx as per pre-operative assessment

## 2024-05-13 NOTE — Interval H&P Note (Signed)
 History and Physical Interval Note:  05/13/2024 7:02 AM  [name]  has presented today for surgery, with the diagnosis of HIGH RISK BREAST CANCER FAMILY HISTORY BREAST CANCER.  The various methods of treatment have been discussed with the patient and family. After consideration of risks, benefits and other options for treatment, the patient has consented to  Procedure(s) with comments: MASTECTOMY, NIPPLE SPARING (Bilateral) - GEN w/PEC BLOCKS BILATERAL RISK REDUCING NIPPLE SPARING MASTECTOMIES RECONSTRUCTION, BREAST (Bilateral) - BILATERAL RECONSTUCTION WITH TISSUE EXPANDER ACELLULAR DERMIS as a surgical intervention.  The patient's history has been reviewed, patient examined, no change in status, stable for surgery.  I have reviewed the patient's chart and labs.  Questions were answered to the patient's satisfaction.     [name]

## 2024-05-13 NOTE — Progress Notes (Signed)
 Assisted Dr. Tacy Dura with left, right, pectoralis, ultrasound guided block. Side rails up, monitors on throughout procedure. See vital signs in flow sheet. Tolerated Procedure well.

## 2024-05-13 NOTE — Interval H&P Note (Signed)
 History and Physical Interval Note:  05/13/2024 7:14 AM  [name]  has presented today for surgery, with the diagnosis of HIGH RISK BREAST CANCER FAMILY HISTORY BREAST CANCER.  The various methods of treatment have been discussed with the patient and family. After consideration of risks, benefits and other options for treatment, the patient has consented to  Procedure(s) with comments: MASTECTOMY, NIPPLE SPARING (Bilateral) - GEN w/PEC BLOCKS BILATERAL RISK REDUCING NIPPLE SPARING MASTECTOMIES RECONSTRUCTION, BREAST (Bilateral) - BILATERAL RECONSTUCTION WITH TISSUE EXPANDER ACELLULAR DERMIS as a surgical intervention.  The patient's history has been reviewed, patient examined, no change in status, stable for surgery.  I have reviewed the patient's chart and labs.  Questions were answered to the patient's satisfaction.     Donnice Bury

## 2024-05-13 NOTE — Op Note (Signed)
 Preoperative diagnosis: high risk for breast cancer, d density breast tissue Postoperative diagnosis: saa Procedure: bilateral risk reducing nipple sparing mastectomies Surgeon Dr Adina Bury Anesthesia general with bilateral pec blocks EBL: < 50 cc Specimens Right breast tissue marked short superior, long lateral , double deep Right breast nevus Left breast tissue marked short superior, long lateral, double deep Drains per plastic surgery Complications none Case turned over to plastic surgery for reconstruction  Indications: 43 year old female who has a family history of breast cancer in her mom at age 91. Her mom's genetic testing was negative. Her mom actually had bilateral breast cancer. She has no biopsies but she has a history of a number of cysts that have been present. She due to her risk that was calculated in 2022 at 25.6% lifetime on Tyrer-Cuzick has been getting staggered mammograms and MRIs. Her last MR is in April 2024 and is negative. Her last mammogram is November 2024 and this is negative as well. She has D density breast tissue she has a significant amount of breast pain and has a lot of issues with her exams. She does have a history of breast implants that were removed in 2021. She has no discharge noted today. She has multiple masses that she states have been present for a long time. I see her earlier this year and calculated TC to be 30%. We discussed multiple options and due to family history, d density breasts and risk level we decided to proceed with bilateral rr nsm with reconstruction.  Procedure: After informed consent was obtained she was taken to the operating room.  She was given antibiotics.  SCDs were placed.  She was placed under general esthesia without complication.  Foley catheter was placed.  She was prepped and draped in a standard sterile surgical fashion.  Surgical timeout was then performed.  I did the left side first.  I made an 8 cm incision 8 cm from  the xiphoid process and inframammary fold.  I then created the posterior flap with cautery of the deep pectoralis fascia from the pectoralis muscle.  I did this to the sternum, latissimus and near the clavicle superiorly.  This encompassed all her breast tissue.  I then created the anterior flap with a combination of cautery but mostly this is actually done with the facelift scissors.  She did not really have any subcutaneous tissue and her breast tissue approached her skin.  We did not make any rents in the skin.  I then removal of breast tissue marked as above.  There was no real tissue to remove under the nipple and the areola at that point.  I obtained hemostasis.  I placed a TXA soaked sponge in the cavity and left that in turn to the other side.  I made a similar incision on the right side.  This did encompass a small nevus that she was concerned about on the inferior pole of the breast.  I sent this separately.  I then created the posterior flap in the similar way.  The anterior flap was created primarily with facelift scissors.  This side was also thinned due to her lack of subcutaneous tissue.  The breast tissue was then removed and marked as above.  I obtained hemostasis and placed a TXA soaked sponge in this cavity and turned the case over to plastic surgery for reconstruction.  I discussed with her husband due to thin flaps will need to monitor closely

## 2024-05-13 NOTE — Anesthesia Procedure Notes (Signed)
 Anesthesia Regional Block: Pectoralis block   Pre-Anesthetic Checklist: , timeout performed,  Correct Patient, Correct Site, Correct Laterality,  Correct Procedure, Correct Position, site marked,  Risks and benefits discussed,  Surgical consent,  Pre-op evaluation,  At surgeon's request and post-op pain management  Laterality: Left and Right  Prep: chloraprep       Needles:  Injection technique: Single-shot  Needle Type: Echogenic Stimulator Needle     Needle Length: 5cm  Needle Gauge: 22     Additional Needles:   Procedures:,,,, ultrasound used (permanent image in chart),,    Narrative:  Start time: 05/13/2024 7:00 AM End time: 05/13/2024 7:15 AM Injection made incrementally with aspirations every 5 mL.  Performed by: Personally  Anesthesiologist: Mallory Manus, MD  Additional Notes: Functioning IV was confirmed and monitors were applied.  A 50mm 22ga Arrow echogenic stimulator needle was used. Sterile prep and drape,hand hygiene and sterile gloves were used. Ultrasound guidance: relevant anatomy identified, needle position confirmed, local anesthetic spread visualized around nerve(s)., vascular puncture avoided.  Image printed for medical record. Negative aspiration and negative test dose prior to incremental administration of local anesthetic. The patient tolerated the procedure well.

## 2024-05-13 NOTE — Anesthesia Postprocedure Evaluation (Signed)
 Anesthesia Post Note  Patient: Engineer, site  Procedure(s) Performed: NIPPLE SPARING MASTECTOMIES (Bilateral: Breast) BREAST RECONSTRUCTION WITH TISSUE EXPANDERS AND ACELLULAR DERMIS (Bilateral: Breast)     Patient location during evaluation: PACU Anesthesia Type: Regional and General Level of consciousness: awake and alert Pain management: pain level controlled Vital Signs Assessment: post-procedure vital signs reviewed and stable Respiratory status: spontaneous breathing, nonlabored ventilation, respiratory function stable and patient connected to nasal cannula oxygen Cardiovascular status: blood pressure returned to baseline and stable Postop Assessment: no apparent nausea or vomiting Anesthetic complications: no   No notable events documented.  Last Vitals:  Vitals:   05/13/24 0710 05/13/24 1032  BP: 116/81   Pulse: 88   Resp: 13   Temp:  (!) 36.3 C  SpO2: 100%     Last Pain:  Vitals:   05/13/24 0651  TempSrc: Temporal  PainSc: 0-No pain                 [name]

## 2024-05-13 NOTE — Transfer of Care (Signed)
 Immediate Anesthesia Transfer of Care Note  Patient: Engineer, site  Procedure(s) Performed: NIPPLE SPARING MASTECTOMIES (Bilateral: Breast) BREAST RECONSTRUCTION WITH TISSUE EXPANDERS AND ACELLULAR DERMIS (Bilateral: Breast)  Patient Location: PACU  Anesthesia Type:GA combined with regional for post-op pain  Level of Consciousness: drowsy  Airway & Oxygen Therapy: Patient Spontanous Breathing and Patient connected to face mask oxygen  Post-op Assessment: Report given to RN and Post -op Vital signs reviewed and stable  Post vital signs: Reviewed and stable  Last Vitals:  Vitals Value Taken Time  BP 117/68 (82)   Temp    Pulse 87 05/13/24 10:32  Resp 12   SpO2 100 % 05/13/24 10:32  Vitals shown include unfiled device data.  Last Pain:  Vitals:   05/13/24 0651  TempSrc: Temporal  PainSc: 0-No pain         Complications: No notable events documented.

## 2024-05-14 ENCOUNTER — Encounter (HOSPITAL_BASED_OUTPATIENT_CLINIC_OR_DEPARTMENT_OTHER): Payer: Self-pay | Admitting: General Surgery

## 2024-05-14 DIAGNOSIS — D242 Benign neoplasm of left breast: Secondary | ICD-10-CM | POA: Diagnosis not present

## 2024-05-14 NOTE — Discharge Summary (Signed)
 Physician Discharge Summary  Patient ID: [name] MRN: [id number] DOB/AGE: [birth year] 43 y.o.  Admit date: 05/13/2024 Discharge date: 05/14/2024  Admission Diagnoses: High risk breast cancer, dense breast tissue, fh breast cancer  Discharge Diagnoses:  Principal Problem:   S/P bilateral mastectomy   Discharged Condition: good  Hospital Course: 43 yof s/p bilateral rrnsm with expander reconstruction. Doing well, expected drain output. Pain well controlled, ready for dc  Consults: None  Significant Diagnostic Studies: none  Treatments: surgery: bilateral rr nsm with expander recon  Discharge Exam: Blood pressure 114/83, pulse 70, temperature 98.9 F (37.2 C), resp. rate 16, height 5' 6 (1.676 m), weight 55.1 kg, last menstrual period 42/03/2016, SpO2 100%. Skin/nac bilaterally healthy with no ecchymosis Drains as expected  Disposition: Discharge disposition: 01-Home or Self Care       Discharge Instructions     Call MD for:  redness, tenderness, or signs of infection (pain, swelling, bleeding, redness, odor or green/yellow discharge around incision site)   Complete by: As directed    Call MD for:  temperature >100.5   Complete by: As directed    Discharge instructions   Complete by: As directed    Ok to remove dressings and shower am 9.18.25. Soap and water ok, pat Tegaderm dry. Do not remove Tegaderms. No creams or ointments over incisions. Do not let drains dangle in shower, attach to lanyard or similar.Strip and record drains twice daily and bring log to clinic visit.  Breast binder or soft compression bra all other times.  Ok to raise arms above shoulders for bathing and dressing.  No house yard work or exercise until cleared by MD.   Patient received all Rx preop. Recommend Miralax or Dulcolax as needed for constipation. Ice packs to chest for comfort.   Driving Restrictions   Complete by: As directed    No driving for 2 weeks   Lifting restrictions    Complete by: As directed    No lifting > 5-10 lbs until cleared by MF   Resume previous diet   Complete by: As directed       Allergies as of 05/14/2024       Reactions   Chlorhexidine Hives   Latex Rash   Nsaids Other (See Comments)   Stomach burning        Medication List     TAKE these medications    calcium carbonate 500 MG chewable tablet Commonly known as: TUMS - dosed in mg elemental calcium Chew 1 tablet by mouth daily.   VITAMIN D (CHOLECALCIFEROL) PO Take by mouth.        Follow-up Information     Ebbie Cough, MD Follow up in 2 week(s).   Specialty: General Surgery Contact information: [address] [place] [address] [phone]         Arelia Filippo, MD Follow up in 1 week(s).   Specialty: Plastic Surgery Why: as scheduled Contact information: [address] [place] [address] [phone]                 Signed: Cough Ebbie 05/14/2024, 7:48 AM

## 2024-05-14 NOTE — Discharge Instructions (Signed)
 About my Jackson-Pratt Bulb Drain  What is a Jackson-Pratt bulb? A Jackson-Pratt is a soft, round device used to collect drainage. It is connected to a long, thin drainage catheter, which is held in place by one or two small stiches near your surgical incision site. When the bulb is squeezed, it forms a vacuum, forcing the drainage to empty into the bulb.  Emptying the Jackson-Pratt bulb- To empty the bulb: 1. Release the plug on the top of the bulb. 2. Pour the bulb's contents into a measuring container which your nurse will provide. 3. Record the time emptied and amount of drainage. Empty the drain(s) as often as your     doctor or nurse recommends.  Date                  Time                    Amount (Drain 1)                 Amount (Drain 2)  _____________________________________________________________________  _____________________________________________________________________  _____________________________________________________________________  _____________________________________________________________________  _____________________________________________________________________  _____________________________________________________________________  _____________________________________________________________________  _____________________________________________________________________  Squeezing the Jackson-Pratt Bulb- To squeeze the bulb: 1. Make sure the plug at the top of the bulb is open. 2. Squeeze the bulb tightly in your fist. You will hear air squeezing from the bulb. 3. Replace the plug while the bulb is squeezed. 4. Use a safety pin to attach the bulb to your clothing. This will keep the catheter from     pulling at the bulb insertion site.  When to call your doctor- Call your doctor if:  Drain site becomes red, swollen or hot.  You have a fever greater than 101 degrees F.  There is oozing at the drain site.  Drain falls out (apply a guaze  bandage over the drain hole and secure it with tape).  Drainage increases daily not related to activity patterns. (You will usually have more drainage when you are active than when you are resting.)  Drainage has a bad odor.

## 2024-05-15 LAB — SURGICAL PATHOLOGY

## 2024-05-20 ENCOUNTER — Other Ambulatory Visit: Payer: Self-pay

## 2024-05-20 ENCOUNTER — Encounter (HOSPITAL_COMMUNITY)
Admission: EM | Disposition: A | Discharge status: Home / Self Care | Payer: Self-pay | Source: Ambulatory Visit | Attending: Plastic Surgery

## 2024-05-20 ENCOUNTER — Encounter (HOSPITAL_COMMUNITY): Payer: Self-pay | Admitting: Plastic Surgery

## 2024-05-20 ENCOUNTER — Ambulatory Visit (HOSPITAL_COMMUNITY): Admitting: Certified Registered Nurse Anesthetist

## 2024-05-20 ENCOUNTER — Ambulatory Visit (HOSPITAL_COMMUNITY)
Admission: EM | Admit: 2024-05-20 | Discharge: 2024-05-20 | Disposition: A | Discharge status: Home / Self Care | Source: Ambulatory Visit | Attending: Plastic Surgery | Admitting: Plastic Surgery

## 2024-05-20 DIAGNOSIS — K219 Gastro-esophageal reflux disease without esophagitis: Secondary | ICD-10-CM | POA: Insufficient documentation

## 2024-05-20 DIAGNOSIS — Q676 Pectus excavatum: Secondary | ICD-10-CM | POA: Diagnosis not present

## 2024-05-20 DIAGNOSIS — X58XXXA Exposure to other specified factors, initial encounter: Secondary | ICD-10-CM | POA: Insufficient documentation

## 2024-05-20 DIAGNOSIS — Z9013 Acquired absence of bilateral breasts and nipples: Secondary | ICD-10-CM | POA: Diagnosis not present

## 2024-05-20 DIAGNOSIS — S20211A Contusion of right front wall of thorax, initial encounter: Secondary | ICD-10-CM | POA: Diagnosis present

## 2024-05-20 DIAGNOSIS — Z803 Family history of malignant neoplasm of breast: Secondary | ICD-10-CM | POA: Diagnosis not present

## 2024-05-20 DIAGNOSIS — S2020XA Contusion of thorax, unspecified, initial encounter: Secondary | ICD-10-CM

## 2024-05-20 HISTORY — PX: HEMATOMA EVACUATION: SHX5118

## 2024-05-20 LAB — CBC WITH DIFFERENTIAL/PLATELET
Abs Immature Granulocytes: 0.05 K/uL (ref 0.00–0.07)
Basophils Absolute: 0.1 K/uL (ref 0.0–0.1)
Basophils Relative: 1 %
Eosinophils Absolute: 0.3 K/uL (ref 0.0–0.5)
Eosinophils Relative: 4 %
HCT: 40 % (ref 36.0–46.0)
Hemoglobin: 13.4 g/dL (ref 12.0–15.0)
Immature Granulocytes: 1 %
Lymphocytes Relative: 28 %
Lymphs Abs: 2.3 K/uL (ref 0.7–4.0)
MCH: 31.2 pg (ref 26.0–34.0)
MCHC: 33.5 g/dL (ref 30.0–36.0)
MCV: 93 fL (ref 80.0–100.0)
Monocytes Absolute: 0.5 K/uL (ref 0.1–1.0)
Monocytes Relative: 7 %
Neutro Abs: 5 K/uL (ref 1.7–7.7)
Neutrophils Relative %: 59 %
Platelets: 251 K/uL (ref 150–400)
RBC: 4.3 MIL/uL (ref 3.87–5.11)
RDW: 12.1 % (ref 11.5–15.5)
WBC: 8.3 K/uL (ref 4.0–10.5)
nRBC: 0 % (ref 0.0–0.2)

## 2024-05-20 LAB — TYPE AND SCREEN
ABO/RH(D): O POS
Antibody Screen: NEGATIVE

## 2024-05-20 LAB — ABO/RH: ABO/RH(D): O POS

## 2024-05-20 SURGERY — EVACUATION HEMATOMA
Anesthesia: General | Site: Breast | Laterality: Right

## 2024-05-20 MED ORDER — DEXAMETHASONE SODIUM PHOSPHATE 10 MG/ML IJ SOLN
INTRAMUSCULAR | Status: AC
Start: 2024-05-20 — End: 2024-05-20
  Filled 2024-05-20: qty 1

## 2024-05-20 MED ORDER — PHENYLEPHRINE 80 MCG/ML (10ML) SYRINGE FOR IV PUSH (FOR BLOOD PRESSURE SUPPORT)
PREFILLED_SYRINGE | INTRAVENOUS | Status: AC
  Filled 2024-05-20: qty 10

## 2024-05-20 MED ORDER — HYDROCODONE-ACETAMINOPHEN 5-325 MG PO TABS: 1.0000 | ORAL_TABLET | Freq: Four times a day (QID) | ORAL | 0 refills | Status: AC | PRN

## 2024-05-20 MED ORDER — ALBUMIN HUMAN 5 % IV SOLN: INTRAVENOUS | Status: DC | PRN

## 2024-05-20 MED ORDER — CEFAZOLIN SODIUM-DEXTROSE 2-4 GM/100ML-% IV SOLN
2.0000 g | INTRAVENOUS | Status: AC
Start: 2024-05-20 — End: 2024-05-20
  Administered 2024-05-20: 2 g via INTRAVENOUS

## 2024-05-20 MED ORDER — SUCCINYLCHOLINE CHLORIDE 200 MG/10ML IV SOSY
PREFILLED_SYRINGE | INTRAVENOUS | Status: AC
  Filled 2024-05-20: qty 10

## 2024-05-20 MED ORDER — MUPIROCIN CALCIUM 2 % EX CREA
TOPICAL_CREAM | CUTANEOUS | Status: AC
Start: 2024-05-20 — End: 2024-05-20
  Filled 2024-05-20: qty 15

## 2024-05-20 MED ORDER — POVIDONE-IODINE 10 % EX SOLN
CUTANEOUS | Status: DC | PRN
  Administered 2024-05-20: 1 via TOPICAL

## 2024-05-20 MED ORDER — ONDANSETRON HCL 4 MG/2ML IJ SOLN
INTRAMUSCULAR | Status: AC
  Filled 2024-05-20: qty 2

## 2024-05-20 MED ORDER — FENTANYL CITRATE (PF) 250 MCG/5ML IJ SOLN
INTRAMUSCULAR | Status: AC
  Filled 2024-05-20: qty 5

## 2024-05-20 MED ORDER — EPHEDRINE 5 MG/ML INJ
INTRAVENOUS | Status: AC
  Filled 2024-05-20: qty 5

## 2024-05-20 MED ORDER — PHENYLEPHRINE 80 MCG/ML (10ML) SYRINGE FOR IV PUSH (FOR BLOOD PRESSURE SUPPORT)
PREFILLED_SYRINGE | INTRAVENOUS | Status: DC | PRN
  Administered 2024-05-20 (×7): 80 ug via INTRAVENOUS

## 2024-05-20 MED ORDER — ROCURONIUM BROMIDE 10 MG/ML (PF) SYRINGE
PREFILLED_SYRINGE | INTRAVENOUS | Status: AC
  Filled 2024-05-20: qty 10

## 2024-05-20 MED ORDER — PROPOFOL 1000 MG/100ML IV EMUL
INTRAVENOUS | Status: AC
  Filled 2024-05-20: qty 100

## 2024-05-20 MED ORDER — DEXMEDETOMIDINE HCL IN NACL 80 MCG/20ML IV SOLN
INTRAVENOUS | Status: DC | PRN
Start: 2024-05-20 — End: 2024-05-20
  Administered 2024-05-20: 4 ug via INTRAVENOUS

## 2024-05-20 MED ORDER — ACETAMINOPHEN 10 MG/ML IV SOLN
INTRAVENOUS | Status: AC
  Filled 2024-05-20: qty 100

## 2024-05-20 MED ORDER — VASHE WOUND IRRIGATION OPTIME
TOPICAL | Status: DC | PRN
  Administered 2024-05-20: 34 [oz_av]

## 2024-05-20 MED ORDER — ROCURONIUM BROMIDE 100 MG/10ML IV SOLN
INTRAVENOUS | Status: DC | PRN
  Administered 2024-05-20: 30 mg via INTRAVENOUS

## 2024-05-20 MED ORDER — SULFAMETHOXAZOLE-TRIMETHOPRIM 800-160 MG PO TABS: 1.0000 | ORAL_TABLET | Freq: Two times a day (BID) | ORAL | 0 refills | Status: DC

## 2024-05-20 MED ORDER — HEMOSTATIC AGENTS (NO CHARGE) OPTIME
TOPICAL | Status: DC | PRN
  Administered 2024-05-20: 1 via TOPICAL

## 2024-05-20 MED ORDER — LIDOCAINE 2% (20 MG/ML) 5 ML SYRINGE
INTRAMUSCULAR | Status: AC
  Filled 2024-05-20: qty 5

## 2024-05-20 MED ORDER — MIDAZOLAM HCL 2 MG/2ML IJ SOLN
INTRAMUSCULAR | Status: DC | PRN
  Administered 2024-05-20: 2 mg via INTRAVENOUS

## 2024-05-20 MED ORDER — 0.9 % SODIUM CHLORIDE (POUR BTL) OPTIME
TOPICAL | Status: DC | PRN
  Administered 2024-05-20 (×2): 1000 mL

## 2024-05-20 MED ORDER — PROPOFOL 500 MG/50ML IV EMUL
INTRAVENOUS | Status: DC | PRN
  Administered 2024-05-20: 125 ug/kg/min via INTRAVENOUS

## 2024-05-20 MED ORDER — MIDAZOLAM HCL 2 MG/2ML IJ SOLN
INTRAMUSCULAR | Status: AC
  Filled 2024-05-20: qty 2

## 2024-05-20 MED ORDER — CEFAZOLIN SODIUM-DEXTROSE 2-4 GM/100ML-% IV SOLN
INTRAVENOUS | Status: AC
  Filled 2024-05-20: qty 100

## 2024-05-20 MED ORDER — SODIUM CHLORIDE 0.9 % IV SOLN
INTRAVENOUS | Status: DC | PRN
  Administered 2024-05-20: 500 mL

## 2024-05-20 MED ORDER — LACTATED RINGERS IV SOLN: INTRAVENOUS | Status: DC | PRN

## 2024-05-20 MED ORDER — DEXAMETHASONE SODIUM PHOSPHATE 10 MG/ML IJ SOLN
INTRAMUSCULAR | Status: DC | PRN
  Administered 2024-05-20: 10 mg via INTRAVENOUS

## 2024-05-20 MED ORDER — OXYCODONE HCL 5 MG PO TABS: 5.0000 mg | ORAL_TABLET | Freq: Once | ORAL | Status: DC | PRN

## 2024-05-20 MED ORDER — SUCCINYLCHOLINE CHLORIDE 200 MG/10ML IV SOSY
PREFILLED_SYRINGE | INTRAVENOUS | Status: DC | PRN
  Administered 2024-05-20: 100 mg via INTRAVENOUS

## 2024-05-20 MED ORDER — FENTANYL CITRATE (PF) 100 MCG/2ML IJ SOLN
INTRAMUSCULAR | Status: DC | PRN
  Administered 2024-05-20 (×2): 50 ug via INTRAVENOUS

## 2024-05-20 MED ORDER — ONDANSETRON HCL 4 MG/2ML IJ SOLN
INTRAMUSCULAR | Status: DC | PRN
  Administered 2024-05-20: 4 mg via INTRAVENOUS

## 2024-05-20 MED ORDER — ACETAMINOPHEN 10 MG/ML IV SOLN
INTRAVENOUS | Status: DC | PRN
  Administered 2024-05-20: 1000 mg via INTRAVENOUS

## 2024-05-20 MED ORDER — OXYCODONE HCL 5 MG/5ML PO SOLN: 5.0000 mg | Freq: Once | ORAL | Status: DC | PRN

## 2024-05-20 MED ORDER — FENTANYL CITRATE (PF) 100 MCG/2ML IJ SOLN
INTRAMUSCULAR | Status: AC
  Filled 2024-05-20: qty 2

## 2024-05-20 MED ORDER — SUGAMMADEX SODIUM 200 MG/2ML IV SOLN
INTRAVENOUS | Status: DC | PRN
Start: 2024-05-20 — End: 2024-05-20
  Administered 2024-05-20: 110.2 mg via INTRAVENOUS

## 2024-05-20 MED ORDER — EPHEDRINE SULFATE-NACL 50-0.9 MG/10ML-% IV SOSY
PREFILLED_SYRINGE | INTRAVENOUS | Status: DC | PRN
  Administered 2024-05-20 (×3): 5 mg via INTRAVENOUS
  Administered 2024-05-20: 10 mg via INTRAVENOUS

## 2024-05-20 MED ORDER — MUPIROCIN 2 % EX OINT
TOPICAL_OINTMENT | CUTANEOUS | Status: AC
Start: 2024-05-20 — End: 2024-05-20
  Filled 2024-05-20: qty 22

## 2024-05-20 MED ORDER — AMISULPRIDE (ANTIEMETIC) 5 MG/2ML IV SOLN: 10.0000 mg | Freq: Once | INTRAVENOUS | Status: DC | PRN

## 2024-05-20 MED ORDER — PROPOFOL 10 MG/ML IV BOLUS
INTRAVENOUS | Status: AC
  Filled 2024-05-20: qty 20

## 2024-05-20 MED ORDER — TRIPLE ANTIBIOTIC 3.5-400-5000 EX OINT
TOPICAL_OINTMENT | CUTANEOUS | Status: DC | PRN
  Administered 2024-05-20: 1 via TOPICAL

## 2024-05-20 MED ORDER — STERILE WATER FOR IRRIGATION IR SOLN
Status: DC | PRN
  Administered 2024-05-20: 1000 mL

## 2024-05-20 MED ORDER — FENTANYL CITRATE (PF) 100 MCG/2ML IJ SOLN
25.0000 ug | INTRAMUSCULAR | Status: DC | PRN
  Administered 2024-05-20: 50 ug via INTRAVENOUS

## 2024-05-20 MED ORDER — PROPOFOL 10 MG/ML IV BOLUS
INTRAVENOUS | Status: DC | PRN
  Administered 2024-05-20: 120 mg via INTRAVENOUS
  Administered 2024-05-20: 30 mg via INTRAVENOUS

## 2024-05-20 MED ORDER — BACITRACIN-NEOMYCIN-POLYMYXIN OINTMENT TUBE
TOPICAL_OINTMENT | CUTANEOUS | Status: AC
  Filled 2024-05-20: qty 14.17

## 2024-05-20 SURGICAL SUPPLY — 52 items
BAG COUNTER SPONGE SURGICOUNT (BAG) ×1 IMPLANT
BAG DECANTER FOR FLEXI CONT (MISCELLANEOUS) IMPLANT
BINDER BREAST LRG (GAUZE/BANDAGES/DRESSINGS) IMPLANT
BLADE CLIPPER SURG (BLADE) IMPLANT
BNDG GAUZE DERMACEA FLUFF 4 (GAUZE/BANDAGES/DRESSINGS) IMPLANT
CANISTER SUCTION 3000ML PPV (SUCTIONS) ×1 IMPLANT
CHLORAPREP W/TINT 26 (MISCELLANEOUS) IMPLANT
CNTNR URN SCR LID CUP LEK RST (MISCELLANEOUS) IMPLANT
COVER SURGICAL LIGHT HANDLE (MISCELLANEOUS) ×1 IMPLANT
DERMABOND ADVANCED .7 DNX12 (GAUZE/BANDAGES/DRESSINGS) IMPLANT
DRAIN CHANNEL 15F RND FF W/TCR (WOUND CARE) IMPLANT
DRAIN CHANNEL 19F RND (DRAIN) IMPLANT
DRAIN WOUND 3/16IN  PERFORATED (DRAIN) IMPLANT
DRAPE HALF SHEET 40X57 (DRAPES) IMPLANT
DRAPE IMP U-DRAPE 54X76 (DRAPES) ×1 IMPLANT
DRAPE INCISE IOBAN 66X45 STRL (DRAPES) IMPLANT
DRAPE LAPAROTOMY 100X72 PEDS (DRAPES) ×1 IMPLANT
DRSG ADAPTIC 3X8 NADH LF (GAUZE/BANDAGES/DRESSINGS) IMPLANT
DRSG TEGADERM 4X4.75 (GAUZE/BANDAGES/DRESSINGS) IMPLANT
DRSG VAC GRANUFOAM LG (GAUZE/BANDAGES/DRESSINGS) IMPLANT
DRSG VAC GRANUFOAM MED (GAUZE/BANDAGES/DRESSINGS) IMPLANT
DRSG VAC GRANUFOAM SM (GAUZE/BANDAGES/DRESSINGS) IMPLANT
ELECT CAUTERY BLADE 6.4 (BLADE) ×1 IMPLANT
ELECT COATED BLADE 2.86 ST (ELECTRODE) ×1 IMPLANT
ELECTRODE REM PT RTRN 9FT ADLT (ELECTROSURGICAL) ×1 IMPLANT
EVACUATOR SILICONE 100CC (DRAIN) IMPLANT
GAUZE PAD ABD 8X10 STRL (GAUZE/BANDAGES/DRESSINGS) ×1 IMPLANT
GAUZE SPONGE 4X4 12PLY STRL (GAUZE/BANDAGES/DRESSINGS) ×1 IMPLANT
GLOVE BIO SURGEON STRL SZ 6 (GLOVE) ×1 IMPLANT
GLOVE SURG SS PI 6.0 STRL IVOR (GLOVE) ×1 IMPLANT
GOWN STRL REUS W/ TWL LRG LVL3 (GOWN DISPOSABLE) ×2 IMPLANT
KIT BASIN OR (CUSTOM PROCEDURE TRAY) ×1 IMPLANT
KIT TURNOVER KIT B (KITS) ×1 IMPLANT
NS IRRIG 1000ML POUR BTL (IV SOLUTION) ×1 IMPLANT
PACK GENERAL/GYN (CUSTOM PROCEDURE TRAY) ×1 IMPLANT
PACK UNIVERSAL I (CUSTOM PROCEDURE TRAY) ×1 IMPLANT
PAD ARMBOARD POSITIONER FOAM (MISCELLANEOUS) ×2 IMPLANT
PENCIL BUTTON HOLSTER BLD 10FT (ELECTRODE) IMPLANT
PIN SAFETY STERILE (MISCELLANEOUS) IMPLANT
POWDER SURGICEL 3.0 GRAM (HEMOSTASIS) IMPLANT
SET HNDPC FAN SPRY TIP SCT (DISPOSABLE) IMPLANT
SURGILUBE 2OZ TUBE FLIPTOP (MISCELLANEOUS) IMPLANT
SUT ETHILON 2 0 FS 18 (SUTURE) IMPLANT
SUT MNCRL AB 3-0 PS2 27 (SUTURE) IMPLANT
SUT MNCRL AB 4-0 PS2 18 (SUTURE) IMPLANT
SUT MON AB 3-0 SH27 (SUTURE) IMPLANT
SUT STRATAFIX SPIRAL PDS+ 0 30 (SUTURE) IMPLANT
SWAB COLLECTION DEVICE MRSA (MISCELLANEOUS) IMPLANT
SWAB CULTURE ESWAB REG 1ML (MISCELLANEOUS) IMPLANT
TOWEL GREEN STERILE (TOWEL DISPOSABLE) ×1 IMPLANT
TOWEL GREEN STERILE FF (TOWEL DISPOSABLE) ×1 IMPLANT
UNDERPAD 30X36 HEAVY ABSORB (UNDERPADS AND DIAPERS) ×1 IMPLANT

## 2024-05-20 NOTE — H&P (Signed)
 Subjective Patient ID: [name] is a 43 y.o. female.     HPI   1 week post op bilateral mastectomies with immediate tissue expander based reconstruction. . Onset right chest swelling approximately 1-1.5 hour ago with rapid filling JP bulbs.   Patient's mother, who is my patient, had history breast cancer bilateral and negative genetic testing. Patient's own risk calculated at 30% lifetime on Tyrer-Cuzick and approximately 20% by Alisa model. She has been receiving screening with MMG and MRI. Patient elected for bilateral risk reducing mastectomies.    MMG 06/2023 benign MRI 11/2022 benign   Patient underwent breast augmentation 2014 saline subglandular by Dr. Mercer. Patient felt she was too large and was planning for downsize. Dr. Verneda recommended to leave implants out for several months to assess whether mastopexy needed. Implants were removed 2021. During this time, her mother was diagnosed with cancer and patient decided to leave implants out. Prior 4 B   Patient has known pectus excavatum.   SAHM. Lives with spouse and two teenage kids.    Review of Systems  All other systems reviewed and are negative.    Objective Physical Exam  Constitutional She appears diaphoretic. She appears distressed.  pale  Cardiovascular: Regular rhythm and normal heart sounds.  tachycardic    Pulmonary/Chest Effort normal and breath sounds normal.     Pectus excavatum present Chest: incisions intact with right chest hematoma   Assessment/Plan Family history of breast cancer At high risk for breast cancer S/p bilateral NSM prepectoral TE/ADM (Alloderm) reconstruction Hematoma right chest     OR this am as emergent procedure for evaucuation right chest hematoma Patient to transfer to [facility] admitting via husband private car.  Earlis Ranks, MD [facility] Plastic & Reconstructive Surgery  Office/ physician access line after hours [phone]

## 2024-05-20 NOTE — Anesthesia Preprocedure Evaluation (Signed)
 Anesthesia Evaluation  Patient identified by MRN, date of birth, ID band Patient awake    Reviewed: Allergy & Precautions, H&P , NPO status , Patient's Chart, lab work & pertinent test results  History of Anesthesia Complications (+) PONV and history of anesthetic complications  Airway Mallampati: I  TM Distance: >3 FB Neck ROM: Full    Dental no notable dental hx. (+) Teeth Intact, Dental Advisory Given   Pulmonary neg pulmonary ROS   Pulmonary exam normal breath sounds clear to auscultation       Cardiovascular Exercise Tolerance: Good negative cardio ROS Normal cardiovascular exam Rhythm:Regular Rate:Normal     Neuro/Psych negative neurological ROS  negative psych ROS   GI/Hepatic negative GI ROS, Neg liver ROS,GERD  ,,  Endo/Other  negative endocrine ROS    Renal/GU negative Renal ROS  negative genitourinary   Musculoskeletal negative musculoskeletal ROS (+)    Abdominal   Peds negative pediatric ROS (+)  Hematology negative hematology ROS (+)   Anesthesia Other Findings   Reproductive/Obstetrics negative OB ROS                              Anesthesia Physical Anesthesia Plan  ASA: 2 and emergent  Anesthesia Plan: General   Post-op Pain Management: Ofirmev  IV (intra-op)* and Toradol  IV (intra-op)*   Induction: Intravenous and Rapid sequence  PONV Risk Score and Plan: 3 and Ondansetron , Dexamethasone , Treatment may vary due to age or medical condition, Midazolam  and TIVA  Airway Management Planned: Oral ETT  Additional Equipment: None  Intra-op Plan:   Post-operative Plan: Extubation in OR  Informed Consent: I have reviewed the patients History and Physical, chart, labs and discussed the procedure including the risks, benefits and alternatives for the proposed anesthesia with the patient or authorized representative who has indicated his/her understanding and  acceptance.     Dental advisory given  Plan Discussed with: Anesthesiologist and CRNA  Anesthesia Plan Comments: (  )         Anesthesia Quick Evaluation

## 2024-05-20 NOTE — Anesthesia Procedure Notes (Signed)
 Procedure Name: Intubation Date/Time: 05/20/2024 10:09 AM  Performed by: Tarence Searcy, CRNAPre-anesthesia Checklist: Patient identified, Emergency Drugs available, Suction available and Patient being monitored Patient Re-evaluated:Patient Re-evaluated prior to induction Oxygen Delivery Method: Circle System Utilized Preoxygenation: Pre-oxygenation with 100% oxygen Induction Type: IV induction, Rapid sequence and Cricoid Pressure applied Laryngoscope Size: Mac and 3 Grade View: Grade I Tube type: Oral Nasal Tubes: Nasal Rae and Nasal prep performed Tube size: 7.0 mm Number of attempts: 1 Airway Equipment and Method: Stylet and Oral airway Placement Confirmation: ETT inserted through vocal cords under direct vision, positive ETCO2 and breath sounds checked- equal and bilateral Secured at: 22 cm Tube secured with: Tape Dental Injury: Teeth and Oropharynx as per pre-operative assessment  Comments: Easy, atraumatic intubation. No jaw thrust or attempted masking due to RSI. Head and neck midline. Lips, teeth, and tongue unchanged.

## 2024-05-20 NOTE — Transfer of Care (Signed)
 Immediate Anesthesia Transfer of Care Note  Patient: [name]  Procedure(s) Performed: EVACUATION HEMATOMA (Right: Breast)  Patient Location: PACU  Anesthesia Type:General  Level of Consciousness: awake  Airway & Oxygen Therapy: Patient Spontanous Breathing  Post-op Assessment: Report given to RN and Post -op Vital signs reviewed and stable  Post vital signs: Reviewed and stable  Last Vitals:  Vitals Value Taken Time  BP 112/63 05/20/24 11:51  Temp    Pulse 101 05/20/24 11:54  Resp 13 05/20/24 11:54  SpO2 100 % 05/20/24 11:54  Vitals shown include unfiled device data.  Last Pain:  Vitals:   05/20/24 0948  TempSrc:   PainSc: 9          Complications: No notable events documented.

## 2024-05-20 NOTE — Op Note (Signed)
 Operative Note   DATE OF OPERATION: 9.23.2025  LOCATION: [facility]-outpatient  SURGICAL DIVISION: Plastic Surgery  PREOPERATIVE DIAGNOSES:  1. High risk breast cancer 2. Acquired absence breasts 3. Hematoma right chest  POSTOPERATIVE DIAGNOSES:  same  PROCEDURE:  Evacuation hematoma right chest  SURGEON: Earlis Ranks MD MBA  ASSISTANT: none  ANESTHESIA:  General.   EBL: 300 ml including hematoma  COMPLICATIONS: None immediate.   INDICATIONS FOR PROCEDURE:  The patient, [name], is a 43 y.o. female born on Mar 28, 1981, is here for treatment acute onset swelling right chest this am, one week following bilateral mastectomies.   FINDINGS: Right chest hematoma with active arterial bleeding mastectomy flap.  DESCRIPTION OF PROCEDURE:  The patient's operative site was marked with the patient in the preoperative area. The patient was taken to the operating room. SCDs were placed and IV antibiotics were given. The patient's operative site was prepped and draped in a sterile fashion. A time out was performed and all information was confirmed to be correct. Incision made in prior inframammary fold scar. Incision carried through superficial fascia to implant cavity. Expander acellular dermis construct was removed and placed in solution containing Ancef  gentamicin  and Betadine . Clot evacuated and active bleeding mastectomy flap controlled. Cavity irrigated with 1 liter saline followed by saline solution containing Ancef  gentamicin  and Betadine . Inspected for hemostasis. Hypochlorous acid irrigation 1 liter used. Optime hemostatic agent applied to cavity. A new 15 and 19 Fr JP was placed and secured with 2-0 nylon. The expander and acellular dermis was washed of clots and replaced in cavity. The expander tabs were secured to chest with 3-0 monocryl. The acellular dermis was secured to inframammary fold with 0 Strattafix suture. Closure completed with 3-0 monocryl in superficial fascia, 3-0  monocryl in dermis, and 4-0 monocryl subcuticular skin closure. Dermoabond applied. Antibiotic ointment placed over nipple which had small epidermolysis following nipple sparing mastectomy.Tegaderms applied. Dry dressing and breast binder applied.   The patient was allowed to wake from anesthesia, extubated and taken to the recovery room in satisfactory condition.   SPECIMENS: none  DRAINS: 15 and 19 Fr JP in right subcutaneous chest  Earlis Ranks, MD [facility] Plastic & Reconstructive Surgery  Office/ physician access line after hours [phone]

## 2024-05-21 ENCOUNTER — Encounter (HOSPITAL_COMMUNITY): Payer: Self-pay | Admitting: Plastic Surgery

## 2024-05-21 LAB — POCT I-STAT EG7
Acid-base deficit: 2 mmol/L (ref 0.0–2.0)
Bicarbonate: 23 mmol/L (ref 20.0–28.0)
Calcium, Ion: 1.24 mmol/L (ref 1.15–1.40)
HCT: 40 % (ref 36.0–46.0)
Hemoglobin: 13.6 g/dL (ref 12.0–15.0)
O2 Saturation: 31 %
Patient temperature: 36.5
Potassium: 3.9 mmol/L (ref 3.5–5.1)
Sodium: 137 mmol/L (ref 135–145)
TCO2: 24 mmol/L (ref 22–32)
pCO2, Ven: 39.6 mmHg — ABNORMAL LOW (ref 44–60)
pH, Ven: 7.369 (ref 7.25–7.43)
pO2, Ven: 20 mmHg — CL (ref 32–45)

## 2024-05-21 NOTE — Anesthesia Postprocedure Evaluation (Signed)
 Anesthesia Post Note  Patient: Ambulance person  Procedure(s) Performed: EVACUATION HEMATOMA (Right: Breast)     Patient location during evaluation: PACU Anesthesia Type: General Level of consciousness: awake and alert Pain management: pain level controlled Vital Signs Assessment: post-procedure vital signs reviewed and stable Respiratory status: spontaneous breathing, nonlabored ventilation, respiratory function stable and patient connected to nasal cannula oxygen Cardiovascular status: blood pressure returned to baseline and stable Postop Assessment: no apparent nausea or vomiting Anesthetic complications: no   No notable events documented.  Last Vitals:  Vitals:   05/20/24 1230 05/20/24 1245  BP: 122/77 114/74  Pulse: (!) 109 96  Resp: 15 14  Temp:  36.6 C  SpO2: 99% 99%    Last Pain:  Vitals:   05/20/24 1245  TempSrc:   PainSc: 4                  Epifanio Lamar BRAVO

## 2024-07-21 NOTE — H&P (Signed)
 Subjective Patient ID: [name] is a 43 y.o. female.     HPI   2.5 mo post op bilateral NSM with immediate TE based reconstruction. Course complicated by onset hematoma right chest POD#7. Scheduled for implant exchange next month.    Patient's mother had history breast cancer bilateral and negative genetic testing. Patient's own risk calculated at 30% lifetime on Tyrer-Cuzick and approximately 20% by Alisa model. Patient elected for bilateral risk reducing mastectomies. Pathology from this benign including left breast 3 mm fibroadenoma.     Patient underwent breast augmentation 2014 saline subglandular by Dr. Mercer. Patient felt she was too large and was planning for downsize. Dr. Verneda recommended to leave implants out for several months to assess whether mastopexy needed. Implants were removed 2021. During this time, her mother was diagnosed with cancer and patient decided to leave implants out. Prior to mastectomies 32 B. Right mastectomy 145 g Left mastectomy 186 g   Patient has known pectus excavatum.   SAHM. Lives with spouse and two teenage kids.    Review of Systems   Objective Physical Exam  Cardiovascular: Normal rate, regular rhythm and normal heart sounds.    Pulmonary/Chest Effort normal and breath sounds normal.     Pectus excavatum present Chest:  Scars maturing Bilateral chest expanded   Sn to nipple R 28 L 28 cm Nipple to IMF R 5 L 5 cm   Assessment/Plan Family history of breast cancer At high risk for breast cancer S/p bilateral NSM prepectoral TE/ADM (Alloderm) reconstruction Hematoma right chest s/p evacuation   Pictures today.    Plan removal bilateral TE and placement implants.   As in prepectoral position recommend silicone implants , HCG or capacity filled implants. Reviewed saline vs silicone, shaped vs round. HCG or capacity filled silicone implants may offer reduced risk visible rippling.  Reviewed MRI or US  surveillance for  rupture with silicone implants. Reviewed examples for 4th generation, capacity filled 4th generation, and HCG implants vs saline implants. Patient asked about shaped implants. Reviewed risk BIA ALCL with this, reviewed all shaped implants are textured devices. Reviewed incidence BIA ALCL with currently available shaped implants in US . Patient has elected for saline today, reports she would rather know when ruptured than worry about it. Plan smooth round. Completed Natrelle Saline physician patient checklist .     Reviewed possible fat grafting. Reviewed purpose of this to thicken flaps limit visible rippling and aid in contour. Reviewed donor site pain, need for compression, variable take graft, fat necrosis that presents as masses and may require work up.She has a paucity donor site for this. Patient agrees and no plans for this.   Additional risks including but not limited to bleeding, seroma, hematoma, damage to adjacent structures, need for additional procedures, asymmetry, unacceptable cosmetic result, infection, blood clots in legs or lungs reviewed.    Rx for Norco and Bactrim  given.   Earlis Ranks, MD [facility] Plastic & Reconstructive Surgery  Office/ physician access line after hours [phone]      Natrelle 133S FV-11-T 300 ml tissue expanders placed bilateral.  RIGHT fill volume 270 ml saline LEFT fil volume 250 ml saline .

## 2024-07-29 ENCOUNTER — Encounter (HOSPITAL_BASED_OUTPATIENT_CLINIC_OR_DEPARTMENT_OTHER): Payer: Self-pay | Admitting: Plastic Surgery

## 2024-07-29 ENCOUNTER — Other Ambulatory Visit: Payer: Self-pay

## 2024-08-04 NOTE — Anesthesia Preprocedure Evaluation (Signed)
 Anesthesia Evaluation  Patient identified by MRN, date of birth, ID band Patient awake    Reviewed: Allergy & Precautions, H&P , NPO status , Patient's Chart, lab work & pertinent test results  History of Anesthesia Complications (+) PONV and history of anesthetic complications  Airway Mallampati: I  TM Distance: >3 FB Neck ROM: Full    Dental no notable dental hx. (+) Teeth Intact, Dental Advisory Given   Pulmonary neg pulmonary ROS   Pulmonary exam normal breath sounds clear to auscultation       Cardiovascular Exercise Tolerance: Good (-) hypertension(-) angina (-) Past MI negative cardio ROS Normal cardiovascular exam Rhythm:Regular Rate:Normal     Neuro/Psych  Headaches  negative psych ROS   GI/Hepatic negative GI ROS, Neg liver ROS,GERD  Controlled,,  Endo/Other  negative endocrine ROSneg diabetes    Renal/GU negative Renal ROS  negative genitourinary   Musculoskeletal negative musculoskeletal ROS (+)    Abdominal   Peds negative pediatric ROS (+)  Hematology negative hematology ROS (+)   Anesthesia Other Findings All: Latex, Chlorhexidine NSAID  Reproductive/Obstetrics negative OB ROS                              Anesthesia Physical Anesthesia Plan  ASA: 2  Anesthesia Plan: General   Post-op Pain Management: Toradol  IV (intra-op)*, Tylenol  PO (pre-op)* and Precedex    Induction: Intravenous and Rapid sequence  PONV Risk Score and Plan: 3 and Ondansetron , Dexamethasone , Treatment may vary due to age or medical condition, Midazolam , TIVA and Propofol  infusion  Airway Management Planned: Oral ETT  Additional Equipment: None  Intra-op Plan:   Post-operative Plan: Extubation in OR  Informed Consent: I have reviewed the patients History and Physical, chart, labs and discussed the procedure including the risks, benefits and alternatives for the proposed anesthesia with the  patient or authorized representative who has indicated his/her understanding and acceptance.     Dental advisory given  Plan Discussed with: CRNA and Anesthesiologist  Anesthesia Plan Comments: (  )         Anesthesia Quick Evaluation

## 2024-08-05 ENCOUNTER — Ambulatory Visit (HOSPITAL_BASED_OUTPATIENT_CLINIC_OR_DEPARTMENT_OTHER): Admitting: Anesthesiology

## 2024-08-05 ENCOUNTER — Other Ambulatory Visit: Payer: Self-pay

## 2024-08-05 ENCOUNTER — Ambulatory Visit (HOSPITAL_BASED_OUTPATIENT_CLINIC_OR_DEPARTMENT_OTHER)
Admission: RE | Admit: 2024-08-05 | Discharge: 2024-08-05 | Disposition: A | Attending: Plastic Surgery | Admitting: Plastic Surgery

## 2024-08-05 ENCOUNTER — Encounter (HOSPITAL_BASED_OUTPATIENT_CLINIC_OR_DEPARTMENT_OTHER): Payer: Self-pay | Admitting: Plastic Surgery

## 2024-08-05 ENCOUNTER — Encounter (HOSPITAL_BASED_OUTPATIENT_CLINIC_OR_DEPARTMENT_OTHER): Admitting: Anesthesiology

## 2024-08-05 ENCOUNTER — Encounter (HOSPITAL_BASED_OUTPATIENT_CLINIC_OR_DEPARTMENT_OTHER): Admission: RE | Disposition: A | Payer: Self-pay | Source: Home / Self Care | Attending: Plastic Surgery

## 2024-08-05 HISTORY — PX: REMOVAL OF BILATERAL TISSUE EXPANDERS WITH PLACEMENT OF BILATERAL BREAST IMPLANTS: SHX6431

## 2024-08-05 SURGERY — REMOVAL, TISSUE EXPANDER, BREAST, BILATERAL, WITH BILATERAL IMPLANT IMPLANT INSERTION
Anesthesia: General | Site: Chest | Laterality: Bilateral

## 2024-08-05 MED ORDER — ONDANSETRON HCL 4 MG/2ML IJ SOLN: 4.0000 mg | Freq: Once | INTRAMUSCULAR | Status: DC | PRN

## 2024-08-05 MED ORDER — ONDANSETRON HCL 4 MG/2ML IJ SOLN
INTRAMUSCULAR | Status: DC | PRN
  Administered 2024-08-05: 4 mg via INTRAVENOUS

## 2024-08-05 MED ORDER — SODIUM CHLORIDE 0.9 % IV SOLN
INTRAVENOUS | Status: AC
  Filled 2024-08-05: qty 10

## 2024-08-05 MED ORDER — CEFAZOLIN SODIUM-DEXTROSE 2-4 GM/100ML-% IV SOLN
INTRAVENOUS | Status: AC
  Filled 2024-08-05: qty 100

## 2024-08-05 MED ORDER — OXYCODONE HCL 5 MG PO TABS: 5.0000 mg | ORAL_TABLET | Freq: Once | ORAL | Status: DC | PRN

## 2024-08-05 MED ORDER — GABAPENTIN 300 MG PO CAPS
ORAL_CAPSULE | ORAL | Status: AC
  Filled 2024-08-05: qty 1

## 2024-08-05 MED ORDER — SODIUM CHLORIDE 0.9 % IR SOLN
Status: DC | PRN
  Administered 2024-08-05: 740 mL

## 2024-08-05 MED ORDER — BUPIVACAINE HCL (PF) 0.5 % IJ SOLN
INTRAMUSCULAR | Status: AC
  Filled 2024-08-05: qty 30

## 2024-08-05 MED ORDER — ACETAMINOPHEN 500 MG PO TABS
ORAL_TABLET | ORAL | Status: AC
  Filled 2024-08-05: qty 2

## 2024-08-05 MED ORDER — LACTATED RINGERS IV SOLN: INTRAVENOUS | Status: DC | PRN

## 2024-08-05 MED ORDER — HYDROMORPHONE HCL 1 MG/ML IJ SOLN
0.2500 mg | INTRAMUSCULAR | Status: DC | PRN
  Administered 2024-08-05: 0.5 mg via INTRAVENOUS

## 2024-08-05 MED ORDER — PROPOFOL 500 MG/50ML IV EMUL
INTRAVENOUS | Status: DC | PRN
  Administered 2024-08-05: 200 ug/kg/min via INTRAVENOUS

## 2024-08-05 MED ORDER — FENTANYL CITRATE (PF) 100 MCG/2ML IJ SOLN
INTRAMUSCULAR | Status: DC | PRN
  Administered 2024-08-05: 100 ug via INTRAVENOUS

## 2024-08-05 MED ORDER — SODIUM CHLORIDE 0.9 % IV SOLN
INTRAVENOUS | Status: DC | PRN
  Administered 2024-08-05: 500 mL

## 2024-08-05 MED ORDER — DEXMEDETOMIDINE HCL IN NACL 80 MCG/20ML IV SOLN
INTRAVENOUS | Status: DC | PRN
  Administered 2024-08-05: 8 ug via INTRAVENOUS
  Administered 2024-08-05: 4 ug via INTRAVENOUS

## 2024-08-05 MED ORDER — GABAPENTIN 300 MG PO CAPS: 300.0000 mg | ORAL_CAPSULE | ORAL | Status: DC

## 2024-08-05 MED ORDER — CEFAZOLIN SODIUM-DEXTROSE 2-3 GM-%(50ML) IV SOLR
INTRAVENOUS | Status: DC | PRN
  Administered 2024-08-05: 2 g via INTRAVENOUS

## 2024-08-05 MED ORDER — MIDAZOLAM HCL (PF) 2 MG/2ML IJ SOLN
INTRAMUSCULAR | Status: DC | PRN
  Administered 2024-08-05: 2 mg via INTRAVENOUS

## 2024-08-05 MED ORDER — ROCURONIUM BROMIDE 10 MG/ML (PF) SYRINGE
PREFILLED_SYRINGE | INTRAVENOUS | Status: AC
  Filled 2024-08-05: qty 10

## 2024-08-05 MED ORDER — LACTATED RINGERS IV SOLN: INTRAVENOUS | Status: DC

## 2024-08-05 MED ORDER — DEXAMETHASONE SOD PHOSPHATE PF 10 MG/ML IJ SOLN
INTRAMUSCULAR | Status: DC | PRN
  Administered 2024-08-05: 5 mg via INTRAVENOUS

## 2024-08-05 MED ORDER — HYDROCODONE-ACETAMINOPHEN 10-325 MG PO TABS: 1.0000 | ORAL_TABLET | Freq: Once | ORAL | Status: DC | PRN

## 2024-08-05 MED ORDER — CELECOXIB 200 MG PO CAPS: 200.0000 mg | ORAL_CAPSULE | ORAL | Status: DC

## 2024-08-05 MED ORDER — LIDOCAINE 2% (20 MG/ML) 5 ML SYRINGE
INTRAMUSCULAR | Status: AC
  Filled 2024-08-05: qty 5

## 2024-08-05 MED ORDER — 0.9 % SODIUM CHLORIDE (POUR BTL) OPTIME
TOPICAL | Status: DC | PRN
  Administered 2024-08-05: 1000 mL

## 2024-08-05 MED ORDER — HYDROCODONE-ACETAMINOPHEN 5-325 MG PO TABS
ORAL_TABLET | ORAL | Status: AC
  Filled 2024-08-05: qty 1

## 2024-08-05 MED ORDER — MIDAZOLAM HCL 2 MG/2ML IJ SOLN
INTRAMUSCULAR | Status: AC
  Filled 2024-08-05: qty 2

## 2024-08-05 MED ORDER — ACETAMINOPHEN 500 MG PO TABS
1000.0000 mg | ORAL_TABLET | Freq: Once | ORAL | Status: AC
  Administered 2024-08-05: 1000 mg via ORAL

## 2024-08-05 MED ORDER — AMISULPRIDE (ANTIEMETIC) 5 MG/2ML IV SOLN: 10.0000 mg | Freq: Once | INTRAVENOUS | Status: DC | PRN

## 2024-08-05 MED ORDER — SCOPOLAMINE 1 MG/3DAYS TD PT72: 1.0000 | MEDICATED_PATCH | TRANSDERMAL | Status: DC

## 2024-08-05 MED ORDER — LIDOCAINE HCL (CARDIAC) PF 100 MG/5ML IV SOSY
PREFILLED_SYRINGE | INTRAVENOUS | Status: DC | PRN
  Administered 2024-08-05: 50 mg via INTRAVENOUS

## 2024-08-05 MED ORDER — OXYCODONE HCL 5 MG PO TABS
ORAL_TABLET | ORAL | Status: AC
  Filled 2024-08-05: qty 1

## 2024-08-05 MED ORDER — POVIDONE-IODINE 10 % EX SOLN
CUTANEOUS | Status: DC | PRN
  Administered 2024-08-05: 1 via TOPICAL

## 2024-08-05 MED ORDER — ACETAMINOPHEN 500 MG PO TABS: 1000.0000 mg | ORAL_TABLET | ORAL | Status: AC

## 2024-08-05 MED ORDER — EPHEDRINE SULFATE (PRESSORS) 25 MG/5ML IV SOSY
PREFILLED_SYRINGE | INTRAVENOUS | Status: DC | PRN
  Administered 2024-08-05: 10 mg via INTRAVENOUS

## 2024-08-05 MED ORDER — PHENYLEPHRINE HCL (PRESSORS) 10 MG/ML IV SOLN
INTRAVENOUS | Status: DC | PRN
  Administered 2024-08-05: 80 ug via INTRAVENOUS
  Administered 2024-08-05 (×2): 160 ug via INTRAVENOUS

## 2024-08-05 MED ORDER — ACETAMINOPHEN 10 MG/ML IV SOLN: 1000.0000 mg | Freq: Once | INTRAVENOUS | Status: DC | PRN

## 2024-08-05 MED ORDER — ONDANSETRON HCL 4 MG/2ML IJ SOLN
INTRAMUSCULAR | Status: AC
  Filled 2024-08-05: qty 2

## 2024-08-05 MED ORDER — PROPOFOL 10 MG/ML IV BOLUS
INTRAVENOUS | Status: AC
  Filled 2024-08-05: qty 20

## 2024-08-05 MED ORDER — CEFAZOLIN SODIUM-DEXTROSE 2-4 GM/100ML-% IV SOLN: 2.0000 g | INTRAVENOUS | Status: DC

## 2024-08-05 MED ORDER — BUPIVACAINE HCL (PF) 0.5 % IJ SOLN
INTRAMUSCULAR | Status: DC | PRN
  Administered 2024-08-05: 30 mL

## 2024-08-05 MED ORDER — FENTANYL CITRATE (PF) 100 MCG/2ML IJ SOLN
INTRAMUSCULAR | Status: AC
  Filled 2024-08-05: qty 2

## 2024-08-05 MED ORDER — OXYCODONE HCL 5 MG/5ML PO SOLN: 5.0000 mg | Freq: Once | ORAL | Status: DC | PRN

## 2024-08-05 MED ORDER — HYDROMORPHONE HCL 1 MG/ML IJ SOLN
INTRAMUSCULAR | Status: AC
  Filled 2024-08-05: qty 0.5

## 2024-08-05 MED ORDER — EPINEPHRINE PF 1 MG/ML IJ SOLN
INTRAMUSCULAR | Status: AC
  Filled 2024-08-05: qty 1

## 2024-08-05 MED ORDER — CELECOXIB 200 MG PO CAPS
ORAL_CAPSULE | ORAL | Status: AC
  Filled 2024-08-05: qty 1

## 2024-08-05 MED ORDER — SCOPOLAMINE 1 MG/3DAYS TD PT72
MEDICATED_PATCH | TRANSDERMAL | Status: AC
  Filled 2024-08-05: qty 1

## 2024-08-05 MED ORDER — SODIUM BICARBONATE 4.2 % IV SOLN
INTRAVENOUS | Status: AC
  Filled 2024-08-05: qty 10

## 2024-08-05 MED ORDER — LIDOCAINE HCL (PF) 1 % IJ SOLN
INTRAMUSCULAR | Status: AC
  Filled 2024-08-05: qty 60

## 2024-08-05 MED ORDER — HYDROCODONE-ACETAMINOPHEN 5-325 MG PO TABS
1.0000 | ORAL_TABLET | Freq: Once | ORAL | Status: AC | PRN
  Administered 2024-08-05: 1 via ORAL

## 2024-08-05 MED ORDER — PROPOFOL 10 MG/ML IV BOLUS
INTRAVENOUS | Status: DC | PRN
  Administered 2024-08-05: 110 mg via INTRAVENOUS

## 2024-08-05 SURGICAL SUPPLY — 69 items
BAG DECANTER FOR FLEXI CONT (MISCELLANEOUS) ×1 IMPLANT
BINDER ABDOMINAL 10 UNV 27-48 (MISCELLANEOUS) IMPLANT
BINDER ABDOMINAL 12 SM 30-45 (SOFTGOODS) IMPLANT
BINDER BREAST 3XL (GAUZE/BANDAGES/DRESSINGS) IMPLANT
BINDER BREAST LRG (GAUZE/BANDAGES/DRESSINGS) IMPLANT
BINDER BREAST MEDIUM (GAUZE/BANDAGES/DRESSINGS) IMPLANT
BINDER BREAST XLRG (GAUZE/BANDAGES/DRESSINGS) IMPLANT
BINDER BREAST XXLRG (GAUZE/BANDAGES/DRESSINGS) IMPLANT
BLADE SURG 10 STRL SS (BLADE) ×2 IMPLANT
BLADE SURG 11 STRL SS (BLADE) ×1 IMPLANT
BNDG GAUZE DERMACEA FLUFF 4 (GAUZE/BANDAGES/DRESSINGS) ×2 IMPLANT
CANISTER LIPO FAT HARVEST (MISCELLANEOUS) ×1 IMPLANT
CANISTER SUCT 1200ML W/VALVE (MISCELLANEOUS) ×1 IMPLANT
CHLORAPREP W/TINT 26 (MISCELLANEOUS) ×2 IMPLANT
COVER BACK TABLE 60X90IN (DRAPES) ×1 IMPLANT
COVER MAYO STAND STRL (DRAPES) ×2 IMPLANT
DERMABOND ADVANCED .7 DNX12 (GAUZE/BANDAGES/DRESSINGS) ×2 IMPLANT
DRAIN CHANNEL 15F RND FF W/TCR (WOUND CARE) IMPLANT
DRAPE TOP ARMCOVERS (MISCELLANEOUS) ×1 IMPLANT
DRAPE U-SHAPE 76X120 STRL (DRAPES) ×1 IMPLANT
DRAPE UTILITY XL STRL (DRAPES) ×1 IMPLANT
DURAPREP 26ML APPLICATOR (WOUND CARE) IMPLANT
ELECT COATED BLADE 2.86 ST (ELECTRODE) ×1 IMPLANT
ELECTRODE BLDE 4.0 EZ CLN MEGD (MISCELLANEOUS) IMPLANT
ELECTRODE REM PT RTRN 9FT ADLT (ELECTROSURGICAL) ×1 IMPLANT
EVACUATOR SILICONE 100CC (DRAIN) IMPLANT
GAUZE PAD ABD 8X10 STRL (GAUZE/BANDAGES/DRESSINGS) ×2 IMPLANT
GLOVE BIO SURGEON STRL SZ 6 (GLOVE) ×2 IMPLANT
GLOVE SURG SS PI 6.0 STRL IVOR (GLOVE) IMPLANT
GLOVE SURG SYN 6.5 PF PI (GLOVE) IMPLANT
GOWN STRL REUS W/ TWL LRG LVL3 (GOWN DISPOSABLE) ×2 IMPLANT
IMPL SALINE SMOOTH 350CC (Breast) IMPLANT
KIT FILL ASEPTIC TRANSFER (MISCELLANEOUS) IMPLANT
KIT FILL SYSTEM UNIVERSAL (SET/KITS/TRAYS/PACK) IMPLANT
LINER CANISTER 1000CC FLEX (MISCELLANEOUS) ×1 IMPLANT
MARKER SKIN DUAL TIP RULER LAB (MISCELLANEOUS) IMPLANT
NDL FILTER BLUNT 18X1 1/2 (NEEDLE) IMPLANT
NDL HYPO 25X1 1.5 SAFETY (NEEDLE) IMPLANT
NDL SAFETY ECLIPSE 18X1.5 (NEEDLE) ×1 IMPLANT
PACK BASIN DAY SURGERY FS (CUSTOM PROCEDURE TRAY) ×1 IMPLANT
PAD ALCOHOL SWAB (MISCELLANEOUS) ×1 IMPLANT
PENCIL SMOKE EVACUATOR (MISCELLANEOUS) ×1 IMPLANT
PIN SAFETY STERILE (MISCELLANEOUS) IMPLANT
SHEET MEDIUM DRAPE 40X70 STRL (DRAPES) ×2 IMPLANT
SIZER BRST SGL USE 350-380CC (SIZER) IMPLANT
SLEEVE SCD COMPRESS KNEE MED (STOCKING) ×1 IMPLANT
SOLN 0.9% NACL POUR BTL 1000ML (IV SOLUTION) IMPLANT
SPIKE FLUID TRANSFER (MISCELLANEOUS) IMPLANT
SPONGE T-LAP 18X18 ~~LOC~~+RFID (SPONGE) ×1 IMPLANT
STAPLER SKIN PROX WIDE 3.9 (STAPLE) ×1 IMPLANT
SUT ETHILON 2 0 FS 18 (SUTURE) IMPLANT
SUT MNCRL AB 4-0 PS2 18 (SUTURE) IMPLANT
SUT PDS AB 2-0 CT2 27 (SUTURE) IMPLANT
SUT VIC AB 3-0 PS1 18XBRD (SUTURE) IMPLANT
SUT VIC AB 3-0 SH 27X BRD (SUTURE) IMPLANT
SUT VIC AB 4-0 PS2 18 (SUTURE) IMPLANT
SYR 10ML LL (SYRINGE) ×4 IMPLANT
SYR 20ML LL LF (SYRINGE) IMPLANT
SYR 50ML LL SCALE MARK (SYRINGE) ×1 IMPLANT
SYR BULB IRRIG 60ML STRL (SYRINGE) ×2 IMPLANT
SYR CONTROL 10ML LL (SYRINGE) IMPLANT
SYR TB 1ML LL NO SAFETY (SYRINGE) ×1 IMPLANT
SYRINGE TOOMEY IRRIG 70ML (MISCELLANEOUS) IMPLANT
TOWEL GREEN STERILE FF (TOWEL DISPOSABLE) ×2 IMPLANT
TUBE CONNECTING 20X1/4 (TUBING) ×1 IMPLANT
TUBING INFILTRATION IT-10001 (TUBING) ×1 IMPLANT
TUBING SET GRADUATE ASPIR 12FT (MISCELLANEOUS) ×1 IMPLANT
UNDERPAD 30X36 HEAVY ABSORB (UNDERPADS AND DIAPERS) ×2 IMPLANT
YANKAUER SUCT BULB TIP NO VENT (SUCTIONS) ×1 IMPLANT

## 2024-08-05 NOTE — Interval H&P Note (Signed)
 History and Physical Interval Note:  08/05/2024 6:45 AM  [name]  has presented today for surgery, with the diagnosis of acquired absence breast high risk breast cancer.  The various methods of treatment have been discussed with the patient and family. After consideration of risks, benefits and other options for treatment, the patient has consented to  removal bilateral chest tissue expanders and placement saline implants as a surgical intervention.  The patient's history has been reviewed, patient examined, no change in status, stable for surgery.  I have reviewed the patient's chart and labs.  Questions were answered to the patient's satisfaction.     [name]

## 2024-08-05 NOTE — Op Note (Signed)
 Operative Note   DATE OF OPERATION: 12.9.2025  LOCATION: [facility]-outpatient  SURGICAL DIVISION: Plastic Surgery  PREOPERATIVE DIAGNOSES:  1. High risk breast cancer 2. Acquired absence breasts  POSTOPERATIVE DIAGNOSES:  same  PROCEDURE:  Removal bilateral chest tissue expanders and placement saline implants  SURGEON: Earlis Ranks MD MBA  ASSISTANT: none  ANESTHESIA:  General.   EBL: 45 ml  COMPLICATIONS: None immediate.   INDICATIONS FOR PROCEDURE:  The patient, [name], is a 43 y.o. female born on April 16, 1981, is here for staged breast reconstruction following nipple sparing mastectomies with immediate prepectoral tissue expander based reconstruction.   FINDINGS: Complete incorporation ADM noted bilateral. Natrelle smooth round saline High Profile 350 ml implants placed bilateral REF [id number] RIGHT fill volume 380 ml LEFT fill volume 360 ml RIGHT SN [id number] LEFT SN [id number]  DESCRIPTION OF PROCEDURE:  The patient's operative site was marked with the patient in the preoperative area. The patient was taken to the operating room. SCDs were placed and IV antibiotics were given. The patient's operative site was prepped and draped in a sterile fashion. A time out was performed and all information was confirmed to be correct. Over left chest, incision made through prior IMF scar and carried through superficial fascia and capsule. Tissue expander removed. Capsulotomies performed superiorly and medial lower pole. Sizer placed.    Over right chest, incision made through prior IMF scar and carried through superficial fascia and capsule. Tissue expander removed. Capsulotomies performed superiorly and medial lower pole. Sizer placed. Patient assessed for symmetry. A High Profile 350 ml implant selected for bilateral placement.    Breast cavities irrigated with saline solution containing ancef , Betadine , and gentamicin . Hemostasis obtained. Implant placed in left chest and  filled to 360 ml. Fill tab closure and implant orientation ensured. Layered closure completed with 3-0 vicryl in capsule and superficial fascia, 4-0 vicryl placed in dermis, followed by running 4-0 monocryl subcuticular for skin closure. Over right chest implant placed and filled to 380 ml. Fill tab closure and implant orientation ensured. Similar closure performed. Tissue adhesive applied. Dry dressing and breast binder placed.    The patient was allowed to wake from anesthesia, extubated and taken to the recovery room in satisfactory condition.   SPECIMENS: none  DRAINS: none  Earlis Ranks, MD [facility] Plastic & Reconstructive Surgery  Office/ physician access line after hours [phone]

## 2024-08-05 NOTE — Anesthesia Procedure Notes (Signed)
 Procedure Name: LMA Insertion Date/Time: 08/05/2024 7:36 AM  Performed by: Burnard Rosaline HERO, CRNAPre-anesthesia Checklist: Patient identified, Emergency Drugs available, Suction available and Patient being monitored Patient Re-evaluated:Patient Re-evaluated prior to induction Oxygen Delivery Method: Circle system utilized Preoxygenation: Pre-oxygenation with 100% oxygen Induction Type: IV induction Ventilation: Mask ventilation without difficulty LMA: LMA inserted LMA Size: 4.0 Number of attempts: 1 Airway Equipment and Method: Bite block Placement Confirmation: positive ETCO2, breath sounds checked- equal and bilateral and CO2 detector Tube secured with: Tape Dental Injury: Teeth and Oropharynx as per pre-operative assessment

## 2024-08-05 NOTE — Discharge Instructions (Addendum)
  Post Anesthesia Home Care Instructions  Activity: Get plenty of rest for the remainder of the day. A responsible individual must stay with you for 24 hours following the procedure.  For the next 24 hours, DO NOT: -Drive a car -Advertising copywriter -Drink alcoholic beverages -Take any medication unless instructed by your physician -Make any legal decisions or sign important papers.  Meals: Start with liquid foods such as gelatin or soup. Progress to regular foods as tolerated. Avoid greasy, spicy, heavy foods. If nausea and/or vomiting occur, drink only clear liquids until the nausea and/or vomiting subsides. Call your physician if vomiting continues.  Special Instructions/Symptoms: Your throat may feel dry or sore from the anesthesia or the breathing tube placed in your throat during surgery. If this causes discomfort, gargle with warm salt water . The discomfort should disappear within 24 hours.  If you had a scopolamine  patch placed behind your ear for the management of post- operative nausea and/or vomiting:  1. The medication in the patch is effective for 72 hours, after which it should be removed.  Wrap patch in a tissue and discard in the trash. Wash hands thoroughly with soap and water . 2. You may remove the patch earlier than 72 hours if you experience unpleasant side effects which may include dry mouth, dizziness or visual disturbances. 3. Avoid touching the patch. Wash your hands with soap and water  after contact with the patch.     You had NORCO 5-325 at 10:15am today. fNext dose of tylenol  may be taken at 6:30p

## 2024-08-05 NOTE — Anesthesia Postprocedure Evaluation (Signed)
 Anesthesia Post Note  Patient: Ambulance Person  Procedure(s) Performed: REMOVAL, TISSUE EXPANDER, BREAST, BILATERAL, WITH BILATERAL IMPLANT IMPLANT INSERTION (Bilateral: Chest)     Patient location during evaluation: PACU Anesthesia Type: General Level of consciousness: awake and alert Pain management: pain level controlled Vital Signs Assessment: post-procedure vital signs reviewed and stable Respiratory status: spontaneous breathing, nonlabored ventilation, respiratory function stable and patient connected to nasal cannula oxygen Cardiovascular status: blood pressure returned to baseline and stable Postop Assessment: no apparent nausea or vomiting Anesthetic complications: no   No notable events documented.  Last Vitals:  Vitals:   08/05/24 0945 08/05/24 1003  BP: 119/67 124/70  Pulse: 74 74  Resp: 10 16  Temp:  37.4 C  SpO2: 100% 99%    Last Pain:  Vitals:   08/05/24 1015  TempSrc:   PainSc: 3                  Garnette DELENA Gab

## 2024-08-05 NOTE — Transfer of Care (Signed)
 Immediate Anesthesia Transfer of Care Note  Patient: [name]  Procedure(s) Performed: REMOVAL, TISSUE EXPANDER, BREAST, BILATERAL, WITH BILATERAL IMPLANT IMPLANT INSERTION (Bilateral: Chest)  Patient Location: PACU  Anesthesia Type:General  Level of Consciousness: awake, alert , and patient cooperative  Airway & Oxygen Therapy: Patient Spontanous Breathing and Patient connected to face mask oxygen  Post-op Assessment: Report given to RN and Post -op Vital signs reviewed and stable  Post vital signs: Reviewed and stable  Last Vitals:  Vitals Value Taken Time  BP 108/75 08/05/24 09:09  Temp    Pulse 87 08/05/24 09:11  Resp 14 08/05/24 09:11  SpO2 100 % 08/05/24 09:11  Vitals shown include unfiled device data.  Last Pain:  Vitals:   08/05/24 0635  TempSrc: Temporal  PainSc: 0-No pain         Complications: No notable events documented.

## 2024-08-06 ENCOUNTER — Encounter (HOSPITAL_BASED_OUTPATIENT_CLINIC_OR_DEPARTMENT_OTHER): Payer: Self-pay | Admitting: Plastic Surgery
